# Patient Record
Sex: Male | Born: 1980 | Race: Black or African American | Hispanic: No | Marital: Single | Smoking: Never smoker
Health system: Southern US, Community
[De-identification: ages and names within clinical notes are randomized; demographics above are authoritative.]

## PROBLEM LIST (undated history)

## (undated) DIAGNOSIS — F419 Anxiety disorder, unspecified: Secondary | ICD-10-CM

## (undated) DIAGNOSIS — K219 Gastro-esophageal reflux disease without esophagitis: Secondary | ICD-10-CM

## (undated) DIAGNOSIS — I10 Essential (primary) hypertension: Secondary | ICD-10-CM

## (undated) DIAGNOSIS — Z87442 Personal history of urinary calculi: Secondary | ICD-10-CM

## (undated) DIAGNOSIS — N200 Calculus of kidney: Secondary | ICD-10-CM

## (undated) HISTORY — DX: Essential (primary) hypertension: I10

---

## 2010-07-29 ENCOUNTER — Emergency Department (HOSPITAL_COMMUNITY): Admission: EM | Admit: 2010-07-29 | Discharge: 2010-07-29 | Payer: Self-pay | Admitting: Emergency Medicine

## 2011-02-13 LAB — URINE CULTURE
Colony Count: NO GROWTH
Culture  Setup Time: 201108301536
Culture: NO GROWTH

## 2011-02-13 LAB — URINALYSIS, ROUTINE W REFLEX MICROSCOPIC
Bilirubin Urine: NEGATIVE
Glucose, UA: NEGATIVE mg/dL
Ketones, ur: NEGATIVE mg/dL
Specific Gravity, Urine: 1.02 (ref 1.005–1.030)
Urobilinogen, UA: 1 mg/dL (ref 0.0–1.0)
pH: 7 (ref 5.0–8.0)

## 2011-02-13 LAB — DIFFERENTIAL
Basophils Absolute: 0 10*3/uL (ref 0.0–0.1)
Basophils Relative: 1 % (ref 0–1)
Eosinophils Absolute: 0.2 10*3/uL (ref 0.0–0.7)

## 2011-02-13 LAB — CBC
Hemoglobin: 15.6 g/dL (ref 13.0–17.0)
Platelets: 314 10*3/uL (ref 150–400)
RBC: 5.51 MIL/uL (ref 4.22–5.81)

## 2011-02-13 LAB — BASIC METABOLIC PANEL
Calcium: 9.2 mg/dL (ref 8.4–10.5)
Sodium: 140 mEq/L (ref 135–145)

## 2011-02-13 LAB — GASTRIC OCCULT BLOOD (1-CARD TO LAB)
Occult Blood, Gastric: POSITIVE — AB
pH, Gastric: 5

## 2014-10-09 ENCOUNTER — Ambulatory Visit (INDEPENDENT_AMBULATORY_CARE_PROVIDER_SITE_OTHER): Payer: 59 | Admitting: Family Medicine

## 2014-10-09 ENCOUNTER — Encounter: Payer: Self-pay | Admitting: Family Medicine

## 2014-10-09 ENCOUNTER — Ambulatory Visit (INDEPENDENT_AMBULATORY_CARE_PROVIDER_SITE_OTHER): Payer: 59 | Admitting: *Deleted

## 2014-10-09 VITALS — BP 177/117 | HR 86 | Temp 98.2°F | Ht 69.0 in | Wt 256.1 lb

## 2014-10-09 DIAGNOSIS — Z7189 Other specified counseling: Secondary | ICD-10-CM

## 2014-10-09 DIAGNOSIS — Z23 Encounter for immunization: Secondary | ICD-10-CM

## 2014-10-09 DIAGNOSIS — Z7689 Persons encountering health services in other specified circumstances: Secondary | ICD-10-CM

## 2014-10-09 DIAGNOSIS — N539 Unspecified male sexual dysfunction: Secondary | ICD-10-CM

## 2014-10-09 DIAGNOSIS — I10 Essential (primary) hypertension: Secondary | ICD-10-CM

## 2014-10-09 LAB — CBC WITH DIFFERENTIAL/PLATELET
BASOS ABS: 0.1 10*3/uL (ref 0.0–0.1)
BASOS PCT: 1 % (ref 0–1)
Eosinophils Absolute: 0.1 10*3/uL (ref 0.0–0.7)
Eosinophils Relative: 2 % (ref 0–5)
HCT: 45.7 % (ref 39.0–52.0)
Hemoglobin: 15.7 g/dL (ref 13.0–17.0)
Lymphocytes Relative: 20 % (ref 12–46)
Lymphs Abs: 1.2 10*3/uL (ref 0.7–4.0)
MCH: 26.8 pg (ref 26.0–34.0)
MCHC: 34.4 g/dL (ref 30.0–36.0)
MCV: 78.1 fL (ref 78.0–100.0)
MONO ABS: 0.3 10*3/uL (ref 0.1–1.0)
MONOS PCT: 5 % (ref 3–12)
NEUTROS ABS: 4.4 10*3/uL (ref 1.7–7.7)
Neutrophils Relative %: 72 % (ref 43–77)
Platelets: 327 10*3/uL (ref 150–400)
RBC: 5.85 MIL/uL — ABNORMAL HIGH (ref 4.22–5.81)
RDW: 14 % (ref 11.5–15.5)
WBC: 6.1 10*3/uL (ref 4.0–10.5)

## 2014-10-09 LAB — POCT URINALYSIS DIPSTICK
BILIRUBIN UA: NEGATIVE
Blood, UA: NEGATIVE
GLUCOSE UA: NEGATIVE
Ketones, UA: NEGATIVE
Leukocytes, UA: NEGATIVE
Nitrite, UA: NEGATIVE
PH UA: 7.5
Protein, UA: NEGATIVE
SPEC GRAV UA: 1.02
Urobilinogen, UA: 0.2

## 2014-10-09 LAB — COMPREHENSIVE METABOLIC PANEL
ALK PHOS: 72 U/L (ref 39–117)
ALT: 18 U/L (ref 0–53)
AST: 19 U/L (ref 0–37)
Albumin: 4.5 g/dL (ref 3.5–5.2)
BUN: 12 mg/dL (ref 6–23)
CO2: 25 meq/L (ref 19–32)
Calcium: 9.3 mg/dL (ref 8.4–10.5)
Chloride: 102 mEq/L (ref 96–112)
Creat: 0.97 mg/dL (ref 0.50–1.35)
GLUCOSE: 95 mg/dL (ref 70–99)
POTASSIUM: 4.3 meq/L (ref 3.5–5.3)
SODIUM: 137 meq/L (ref 135–145)
TOTAL PROTEIN: 7.4 g/dL (ref 6.0–8.3)
Total Bilirubin: 0.9 mg/dL (ref 0.2–1.2)

## 2014-10-09 MED ORDER — HYDROCHLOROTHIAZIDE 25 MG PO TABS: 25.0000 mg | ORAL_TABLET | Freq: Every day | ORAL | Status: DC

## 2014-10-09 NOTE — Patient Instructions (Signed)
Thanks for coming in today!   You will be started on a new medication for your blood pressure. It is Hydrochlorothiazide. You will take one pill daily.   As we discussed, it would be a good idea to get a blood pressure cuff that you can use to monitor your blood pressure at home.   I will call you with the results of your blood work whenever it returns.   Make an appointment to be seen back here in the clinic in 2 weeks so that we can check on your blood pressure and get the rest of your blood work. At your follow up appointment, be sure that you come to the clinic without having eaten anything in the past 8 hours.   Thanks for letting us take care of you.   Sincerely,  Devota Pacealeb Marirose Deveney, MD Family Medicine - PGY 1

## 2014-10-17 ENCOUNTER — Encounter: Payer: Self-pay | Admitting: Family Medicine

## 2014-10-17 DIAGNOSIS — N539 Unspecified male sexual dysfunction: Secondary | ICD-10-CM | POA: Insufficient documentation

## 2014-10-17 DIAGNOSIS — I10 Essential (primary) hypertension: Secondary | ICD-10-CM | POA: Insufficient documentation

## 2014-10-17 NOTE — Progress Notes (Signed)
Patient ID: Michael Rich, male   DOB: 1981-07-01, 33 y.o.   MRN: [id number]   [facility] Yolande Jollyaleb G Dawsyn Ramsaran, MD Phone: [phone]  Subjective:   # Pt. Here to establish care with new PCP - He says that up to this point he has not had any health problems.  - He says that he has previously had elevated BP at employment screening physical exams, but has never been diagnosed with high blood pressure.  - He says that he is rarely ill.  - He does not smoke or drink alcohol.  - He does not have any significant family history.  - He has never taken any medications, and he is not allergic to any medications.  - At this time he does not report any vision changes, palpitations, SOB, or fatigue. He has a normal level of activity.   # HTN  - pt. With blood pressure of 177/117 today.  - He denies headache, vision changes, palpitations, or SOB.  - He has no known family history of HTN.   # Sexual Dysfunction  - pt. Reports that he has been having difficulty sustaining an erection after initial erection during sexual activity with his wife.  - He does have night time tumesence, and he does report anxiety prior to sex intermittently.  - He denies painful erection, or numbness / tingling / coldness in the genital / groin area.   All relevant systems were reviewed and were negative unless otherwise noted in the HPI  Past Medical History Reviewed problem list.  Medications- reviewed and updated Current Outpatient Prescriptions  Medication Sig Dispense Refill  . hydrochlorothiazide (HYDRODIURIL) 25 MG tablet Take 1 tablet (25 mg total) by mouth daily. 90 tablet 3   No current facility-administered medications for this visit.   Chief complaint-noted No additions to family history Social history- patient is a non smoker  Objective: BP 177/117 mmHg  Pulse 86  Temp(Src) 98.2 F (36.8 C) (Oral)  Ht 5\' 9"  (1.753 m)  Wt 256 lb 1.6 oz (116.166 kg)  BMI 37.80 kg/m2 Gen:  NAD, alert, cooperative with exam HEENT: NCAT, EOMI, PERRL Neck: FROM, supple, No LAD, No thyromegally CV: RRR, good S1/S2, no murmur, 2+ distal pulses Resp: CTABL, no wheezes, non-labored Abd: SNTND, BS present, no guarding or organomegaly Ext: No edema, warm, normal tone, moves UE/LE spontaneously G/U:  Deferred at this visit Neuro: Alert and oriented, No gross deficits Skin: no rashes no lesions  Assessment/Plan: See problem based a/p

## 2014-10-17 NOTE — Assessment & Plan Note (Signed)
A: pt. With 177/117 BP in clinic today and he reports previous high blood pressure readings in the 160's. No headache, vision changes, palpitations, or SOB at this time.   P:  - Started on HCTZ 12.5mg  qd at this time.  - BMET, CBC, Lipid profile today - F/U HTN in 2 weeks for further change in therapy if current therapy is ineffective.

## 2014-10-17 NOTE — Assessment & Plan Note (Signed)
A: Pt. With complaints of non-sustained erection during attempted sex with his wife. He denies painful erection, and endorses night time erection. He denies dysuria, and denies numbness, tingling, coldness, or other abnormalities. He does endorse anxiety prior to sex.   P:  - Will hold off on evaluating further at this time as he is establishing care at this visit today.  - Will not start erection augmentation therapy as he is starting HCTZ today for BP and would not like to start another agent at the same time.  - Will reevaluate in 2 weeks when he returns for HTN check up.  - Instructed to record events during sex, and if he notices any triggers for nonsustained erection, or if he notices excessive anxiety.

## 2014-10-23 ENCOUNTER — Encounter: Payer: Self-pay | Admitting: Family Medicine

## 2014-10-23 ENCOUNTER — Ambulatory Visit (INDEPENDENT_AMBULATORY_CARE_PROVIDER_SITE_OTHER): Payer: 59 | Admitting: Family Medicine

## 2014-10-23 VITALS — BP 162/128 | HR 84 | Temp 98.2°F | Ht 69.0 in | Wt 256.5 lb

## 2014-10-23 DIAGNOSIS — I10 Essential (primary) hypertension: Secondary | ICD-10-CM

## 2014-10-23 DIAGNOSIS — N539 Unspecified male sexual dysfunction: Secondary | ICD-10-CM

## 2014-10-23 NOTE — Assessment & Plan Note (Signed)
A: Pt. With continued elevated BP 162 / 128 today. Admitted noncompliance today. Will have him return in one week for nurse BP check, and if still elevated will adjust therapy as indicated. No S/S of end organ damage at this time. Last BMET within normal limits.   P:  - Continue HCTZ 25mg  daily at this time.  - Will add ACEI if BP remains elevated in one week despite current therapy.

## 2014-10-23 NOTE — Progress Notes (Signed)
Patient ID: Michael Rich, male   DOB: 1981-06-11, 33 y.o.   MRN: [id number]   [facility] Yolande Jollyaleb G Anira Senegal, MD Phone: [phone]  Subjective:   # Hypertension:  - Pt. With BP 177 / 114 at last office visit. Reports that he has been taking medication daily, but that he did not take his medication this morning. BP today is 162 / 128.  - He denies headache, chest pain , SOB, vision changes.  - He says that he will start taking his blood pressure medicine again upon returning home.   # Sexual Dysfunction   - Pt. With complaints of being unable to sustain an erection longer than 1-2 minutes.  - He reports that he is able to ejaculate despite being unable to sustain an erection.  - He has tumesence at night time upon waking from sleep.  - He reports mild anxiety with sexual activity. He says that he has always had some anxiety with sex.  - He denies anxiety in other situations except for reporting some mild anxiety in large crowds or with meeting new people.  - He denies symptoms of depressed mood, anhedonia, weight gain, difficulty sleeping, or hypersomnia.  - He denies relational dysfunction with his girlfriend. He says that they have a great relationship, but are both dissatisfied with his sexual performance at this time.  - He denies pain with sex.  - He and his partner have tried manual stimulation without the ability to sustain erection.  - He reports excellent desire for sex and for his partner.  - He denies loss of stamina / disinterest in daily activities. - He has no history of drug abuse, ETOH abuse, or smoking.  - He exercises very little at this time.    All relevant systems were reviewed and were negative unless otherwise noted in the HPI  Past Medical History Reviewed problem list.  Medications- reviewed and updated Current Outpatient Prescriptions  Medication Sig Dispense Refill  . hydrochlorothiazide (HYDRODIURIL) 25 MG tablet Take 1 tablet (25  mg total) by mouth daily. 90 tablet 3   No current facility-administered medications for this visit.   Chief complaint-noted No additions to family history Social history- patient is a non smoker  Objective: BP 162/128 mmHg  Pulse 84  Temp(Src) 98.2 F (36.8 C) (Oral)  Ht 5\' 9"  (1.753 m)  Wt 256 lb 8 oz (116.348 kg)  BMI 37.86 kg/m2 Gen: NAD, alert, cooperative with exam HEENT: NCAT, EOMI, PERRL Neck: FROM, supple CV: RRR, good S1/S2, no murmur, PMI appropriate.  Resp: CTABL, no wheezes, non-labored Abd: SNTND, BS present, no guarding or organomegaly GU:  Normal male exam, no deformities, no evidence of peyrone's disease, urethra normal, testicles of normal size without tenderness. Positive cremasteric reflex.  Ext: No edema, warm, normal tone, moves UE/LE spontaneously Neuro: Alert and oriented, No gross deficits Skin: no rashes no lesions  Assessment/Plan: See problem based a/p

## 2014-10-23 NOTE — Patient Instructions (Signed)
Thanks for coming in today!  We will plan to have you come back for a nurse's visit next Monday in order to check your blood pressure. We need to get your blood pressure under control before prescribing other medications at this point.   You may check with your insurance provider to see if you have access to counseling. If so, it would be an excellent place to start.   Exercise is very important for many reasons, but will help with your blood pressure and with reducing anxiety while boosting libido. Exercise by walking or doing light weight. Don't work too hard until we are able to control your blood pressure.   Thanks for coming in today!   Sincerely,  Devota Pacealeb Aubrielle Stroud, MD Family Medicine - PGY 1

## 2014-10-23 NOTE — Assessment & Plan Note (Signed)
A: Pt. With ongoing sexual dysfunction that he says he has had since beginning to have sex at 33 years of age. He reports that he feels that his symptoms are mainly anxiety related. He scored 18/25 on IIEF survey in category A regarding Erectile Function. He does have night time tumesence. At this time he is open to psychosexual therapy as well and feels that that may help. Would hold off on initiating Viagra given ongoing elevated BP and danger of overexertion if BP is uncontrolled.   P:  - Discussed psychosexual therapy.  - Handout given - Will hold off on PDE 5 inhibitor at this time as his BP remains uncontrolled, and IIEF survey is not below threshold of 14 for starting PDE 5 indicating that his symptoms are more likely psychosexual rather than organic.  - Relationship with spouse discussed.

## 2014-10-29 ENCOUNTER — Ambulatory Visit (INDEPENDENT_AMBULATORY_CARE_PROVIDER_SITE_OTHER): Payer: 59 | Admitting: *Deleted

## 2014-10-29 VITALS — BP 170/100 | HR 90

## 2014-10-29 DIAGNOSIS — I10 Essential (primary) hypertension: Secondary | ICD-10-CM

## 2014-10-29 DIAGNOSIS — Z0130 Encounter for examination of blood pressure without abnormal findings: Secondary | ICD-10-CM

## 2014-10-29 DIAGNOSIS — Z136 Encounter for screening for cardiovascular disorders: Secondary | ICD-10-CM

## 2014-10-29 NOTE — Progress Notes (Signed)
   Pt in nurse clinic for blood pressure check.  BP 170/100 manually, heart rate 90.  Pt denies any chest pain, dizziness, SOB, numbness/tingling of extremities.  Pt stated he is having headaches; sometimes relieved with OTC medication.  Pt advised to call clinic or go to ER with complaints of any of the symptoms listed.  Pt stated understanding.  Pt did take BP medication today. Will forward to PCP.  Clovis PuMartin, Marcella Charlson L, RN

## 2015-10-03 ENCOUNTER — Ambulatory Visit (INDEPENDENT_AMBULATORY_CARE_PROVIDER_SITE_OTHER): Payer: Self-pay | Admitting: Family Medicine

## 2015-10-03 ENCOUNTER — Encounter: Payer: Self-pay | Admitting: Family Medicine

## 2015-10-03 VITALS — BP 160/98 | HR 82 | Temp 98.1°F | Ht 69.0 in | Wt 249.0 lb

## 2015-10-03 DIAGNOSIS — N539 Unspecified male sexual dysfunction: Secondary | ICD-10-CM

## 2015-10-03 DIAGNOSIS — K219 Gastro-esophageal reflux disease without esophagitis: Secondary | ICD-10-CM | POA: Insufficient documentation

## 2015-10-03 DIAGNOSIS — I10 Essential (primary) hypertension: Secondary | ICD-10-CM

## 2015-10-03 DIAGNOSIS — Z83438 Family history of other disorder of lipoprotein metabolism and other lipidemia: Secondary | ICD-10-CM

## 2015-10-03 DIAGNOSIS — Z8349 Family history of other endocrine, nutritional and metabolic diseases: Secondary | ICD-10-CM

## 2015-10-03 DIAGNOSIS — Z23 Encounter for immunization: Secondary | ICD-10-CM

## 2015-10-03 MED ORDER — AMLODIPINE BESYLATE 5 MG PO TABS: 10.0000 mg | ORAL_TABLET | Freq: Every day | ORAL | Status: AC

## 2015-10-03 MED ORDER — AMLODIPINE BESYLATE 10 MG PO TABS: 10.0000 mg | ORAL_TABLET | Freq: Every day | ORAL | Status: DC

## 2015-10-03 NOTE — Assessment & Plan Note (Signed)
Pt. To try to visit therapist again with his GF. He understands that we cannot use PDE5 inhibitor until we get his BP under control.  - F/U as needed.  - Predominantly an anxiety component rather than organic disorder.

## 2015-10-03 NOTE — Progress Notes (Signed)
Patient ID: Michael Rich, male   DOB: 05/14/1981, 34 y.o.   MRN: [id number]   [facility] Yolande Jollyaleb G Skye Rodarte, MD Phone: [phone]  Subjective:   # HTN - Pt. Presents today for folloow up of HTN - His initial BP today is 168 / 93 with subsequent 144/78.  - he says that he is mostly compliant with his HCTZ, but does intermittently miss doses.  - He has not had any CP, SOB, LE swelling, or vision changes.  - He says that he does not get headaches when his bp is high. - He does not check his bp at home.  - Has not been able to be compliant with low sodium diet.   # Acid Reflux   - Complains of some discomfort after eating large meals.  - Is not daily, does not occur at night, but he says that it happens every now and then.  - Has not tried anything for this.  - He does not have dysphagia, or odynophagia, or weight loss.  - no dark, or bloody stools.   # Sexual dysfunction  - Never followed up with the therapist after our last discussion.  - He says that he is still struggling with this with his girlfriend.  - He says he will try to see the therapist.    All relevant systems were reviewed and were negative unless otherwise noted in the HPI  Past Medical History Reviewed problem list.  Medications- reviewed and updated Current Outpatient Prescriptions  Medication Sig Dispense Refill  . amLODipine (NORVASC) 5 MG tablet Take 2 tablets (10 mg total) by mouth daily. Take 0.5 tablet per day for the first week. 90 tablet 5  . hydrochlorothiazide (HYDRODIURIL) 25 MG tablet Take 1 tablet (25 mg total) by mouth daily. 90 tablet 3   No current facility-administered medications for this visit.   Chief complaint-noted No additions to family history Social history- patient is a non smoker, does not drink, or use other illegal substances.   Objective: BP 160/98 mmHg  Pulse 82  Temp(Src) 98.1 F (36.7 C) (Oral)  Ht 5\' 9"  (1.753 m)  Wt 249 lb (112.946 kg)  BMI  36.75 kg/m2 Gen: NAD, alert, cooperative with exam HEENT: NCAT, EOMI, PERRL Neck: FROM, supple CV: RRR, good S1/S2, no murmur Resp: CTABL, no wheezes, non-labored Abd: SNTND, BS present, no guarding or organomegaly Ext: No edema, warm, normal tone, moves UE/LE spontaneously Neuro: Alert and oriented, No gross deficits Skin: no rashes no lesions  Assessment/Plan: See problem based a/p  Essential hypertension Pt. With continued elevated BP despite monotherapy with HCTZ 25mg . He needs much better control given his age for long term risk reduction.   - Started on Amlodipine 5mg  daily.  - Goal < 140/90 - Will check Lipids for screening as well.  - F/U in 2 weeks for BP check.   Male sexual dysfunction Pt. To try to visit therapist again with his GF. He understands that we cannot use PDE5 inhibitor until we get his BP under control.  - F/U as needed.  - Predominantly an anxiety component rather than organic disorder.   GERD (gastroesophageal reflux disease) Reflux by history. Will use tum's for now. PPI in the future if uncontrolled by TUMs. No red flag symptoms.

## 2015-10-03 NOTE — Assessment & Plan Note (Signed)
Pt. With continued elevated BP despite monotherapy with HCTZ 25mg . He needs much better control given his age for long term risk reduction.   - Started on Amlodipine 5mg  daily.  - Goal < 140/90 - Will check Lipids for screening as well.  - F/U in 2 weeks for BP check.

## 2015-10-03 NOTE — Assessment & Plan Note (Addendum)
Reflux by history. Will use tum's for now. PPI in the future if uncontrolled by TUMs. No red flag symptoms.

## 2015-10-03 NOTE — Patient Instructions (Addendum)
Thanks for coming in today.   Your blood pressure is still very high. We need to get it down to 140 / 90. You were 160/98 today.   Continue to take the Hydrochlorothiazide, and you will also start the Norvasc 5mg  pill. Take this daily.   Schedule an appointment to get your blood pressure checked in 2 weeks at a Nurse's visit.   Schedule a lab appointment for any day in the next two weeks that is convenient for you. This should be in the morning before you have eaten anything.   Take Tum's for acid reflux as needed. If you remain symptomatic, call back and we can send in a different medication.   Go to the therapist with your girlfriend. This will be the most beneficial first step.   Thanks for letting us take care of you.   Sincerely,  Devota Pacealeb Ranisha Allaire, MD Family Medicine - PGY 2

## 2015-10-07 ENCOUNTER — Telehealth: Payer: Self-pay | Admitting: Family Medicine

## 2015-10-07 DIAGNOSIS — I10 Essential (primary) hypertension: Secondary | ICD-10-CM

## 2015-10-07 MED ORDER — HYDROCHLOROTHIAZIDE 25 MG PO TABS: 25.0000 mg | ORAL_TABLET | Freq: Every day | ORAL | Status: DC

## 2015-10-07 NOTE — Telephone Encounter (Signed)
Pt called because he was seen last and the doctor was going to call in a new medication for him. He doesn't remember the name. Please send this to his new pharmacy [facility] [address] jw

## 2015-10-07 NOTE — Telephone Encounter (Signed)
Medication resent to new pharmacy.  Laurey Salser,CMA ° °

## 2015-10-14 ENCOUNTER — Other Ambulatory Visit: Payer: Self-pay | Admitting: *Deleted

## 2015-10-14 ENCOUNTER — Other Ambulatory Visit: Payer: 59

## 2015-10-14 DIAGNOSIS — I10 Essential (primary) hypertension: Secondary | ICD-10-CM

## 2015-10-14 LAB — LIPID PANEL
Cholesterol: 195 mg/dL (ref 125–200)
HDL: 27 mg/dL — ABNORMAL LOW (ref >=40)
LDL CALC: 133 mg/dL — AB (ref <130)
Total CHOL/HDL Ratio: 7.2 Ratio — ABNORMAL HIGH (ref <=5.0)
Triglycerides: 174 mg/dL — ABNORMAL HIGH (ref <150)
VLDL: 35 mg/dL — AB (ref <30)

## 2015-10-14 NOTE — Progress Notes (Signed)
flp done today Michael Rich 

## 2015-10-31 ENCOUNTER — Telehealth: Payer: Self-pay | Admitting: Family Medicine

## 2015-10-31 NOTE — Telephone Encounter (Signed)
Called to discuss recent lipid panel results. Slightly elevated. He needs lifestyle modification at this point. We will recheck in 6 months, and initiate treatment in the future as indicated. Pt. Acknowledged this result, and will schedule appt. In 6 months.   Devota Pacealeb Wisdom Seybold, MD Family Medicine - PGY 2

## 2016-03-08 ENCOUNTER — Encounter (HOSPITAL_COMMUNITY): Payer: Self-pay | Admitting: *Deleted

## 2016-03-08 ENCOUNTER — Emergency Department (HOSPITAL_COMMUNITY): Payer: Self-pay

## 2016-03-08 ENCOUNTER — Emergency Department (HOSPITAL_COMMUNITY)
Admission: EM | Admit: 2016-03-08 | Discharge: 2016-03-08 | Disposition: A | Discharge status: Home / Self Care | Payer: Self-pay | Attending: Emergency Medicine | Admitting: Emergency Medicine

## 2016-03-08 DIAGNOSIS — I10 Essential (primary) hypertension: Secondary | ICD-10-CM | POA: Insufficient documentation

## 2016-03-08 DIAGNOSIS — N23 Unspecified renal colic: Secondary | ICD-10-CM | POA: Insufficient documentation

## 2016-03-08 DIAGNOSIS — Z87442 Personal history of urinary calculi: Secondary | ICD-10-CM | POA: Insufficient documentation

## 2016-03-08 DIAGNOSIS — Z79899 Other long term (current) drug therapy: Secondary | ICD-10-CM | POA: Insufficient documentation

## 2016-03-08 LAB — URINALYSIS, ROUTINE W REFLEX MICROSCOPIC
Bilirubin Urine: NEGATIVE
Glucose, UA: NEGATIVE mg/dL
Ketones, ur: NEGATIVE mg/dL
Leukocytes, UA: NEGATIVE
Nitrite: NEGATIVE
Protein, ur: NEGATIVE mg/dL
Specific Gravity, Urine: 1.02 (ref 1.005–1.030)
pH: 6.5 (ref 5.0–8.0)

## 2016-03-08 LAB — CBC
HEMATOCRIT: 45.4 % (ref 39.0–52.0)
HEMOGLOBIN: 15.3 g/dL (ref 13.0–17.0)
MCH: 26.7 pg (ref 26.0–34.0)
MCHC: 33.7 g/dL (ref 30.0–36.0)
MCV: 79.2 fL (ref 78.0–100.0)
Platelets: 388 10*3/uL (ref 150–400)
RBC: 5.73 MIL/uL (ref 4.22–5.81)
RDW: 13.8 % (ref 11.5–15.5)
WBC: 9.5 10*3/uL (ref 4.0–10.5)

## 2016-03-08 LAB — COMPREHENSIVE METABOLIC PANEL WITH GFR
ALT: 24 U/L (ref 17–63)
AST: 27 U/L (ref 15–41)
Albumin: 3.8 g/dL (ref 3.5–5.0)
Alkaline Phosphatase: 64 U/L (ref 38–126)
Anion gap: 13 (ref 5–15)
BUN: 12 mg/dL (ref 6–20)
CO2: 27 mmol/L (ref 22–32)
Calcium: 9.2 mg/dL (ref 8.9–10.3)
Chloride: 100 mmol/L — ABNORMAL LOW (ref 101–111)
Creatinine, Ser: 1.32 mg/dL — ABNORMAL HIGH (ref 0.61–1.24)
GFR calc Af Amer: >60 mL/min
GFR calc non Af Amer: >60 mL/min
Glucose, Bld: 124 mg/dL — ABNORMAL HIGH (ref 65–99)
Potassium: 3.4 mmol/L — ABNORMAL LOW (ref 3.5–5.1)
Sodium: 140 mmol/L (ref 135–145)
Total Bilirubin: 0.9 mg/dL (ref 0.3–1.2)
Total Protein: 7.6 g/dL (ref 6.5–8.1)

## 2016-03-08 LAB — URINE MICROSCOPIC-ADD ON: Squamous Epithelial / LPF: NONE SEEN

## 2016-03-08 LAB — LIPASE, BLOOD: LIPASE: 19 U/L (ref 11–51)

## 2016-03-08 MED ORDER — HYDROMORPHONE HCL 1 MG/ML IJ SOLN
1.0000 mg | Freq: Once | INTRAMUSCULAR | Status: AC
  Administered 2016-03-08: 1 mg via INTRAVENOUS
  Filled 2016-03-08: qty 1

## 2016-03-08 MED ORDER — KETOROLAC TROMETHAMINE 30 MG/ML IJ SOLN
30.0000 mg | Freq: Once | INTRAMUSCULAR | Status: AC
  Administered 2016-03-08: 30 mg via INTRAVENOUS
  Filled 2016-03-08: qty 1

## 2016-03-08 MED ORDER — FENTANYL CITRATE (PF) 100 MCG/2ML IJ SOLN
50.0000 ug | INTRAMUSCULAR | Status: DC | PRN
  Administered 2016-03-08: 50 ug via INTRAVENOUS
  Filled 2016-03-08: qty 2

## 2016-03-08 MED ORDER — OXYCODONE-ACETAMINOPHEN 5-325 MG PO TABS: 1.0000 | ORAL_TABLET | ORAL | Status: DC | PRN

## 2016-03-08 MED ORDER — TAMSULOSIN HCL 0.4 MG PO CAPS: 0.4000 mg | ORAL_CAPSULE | Freq: Every day | ORAL | Status: DC

## 2016-03-08 MED ORDER — PROMETHAZINE HCL 25 MG PO TABS
25.0000 mg | ORAL_TABLET | Freq: Four times a day (QID) | ORAL | Status: DC | PRN
Start: 2016-03-08 — End: 2021-02-18

## 2016-03-08 MED ORDER — ONDANSETRON 4 MG PO TBDP
8.0000 mg | ORAL_TABLET | Freq: Once | ORAL | Status: AC
  Administered 2016-03-08: 8 mg via ORAL
  Filled 2016-03-08: qty 2

## 2016-03-08 NOTE — ED Provider Notes (Signed)
CSN: [id number]     Arrival date & time 03/08/16  0035 History  By signing my name below, I, Phillis Haggis, attest that this documentation has been prepared under the direction and in the presence of Gilda Crease, MD. Electronically Signed: Phillis Haggis, ED Scribe. 03/08/2016. 2:36 AM.  Chief Complaint  Patient presents with  . Abdominal Pain   The history is provided by the patient. No language interpreter was used.  HPI Comments: Michael Rich is a 35 y.o. male with a hx of kidney stones who presents to the Emergency Department complaining of left flank pain onset 4 hours ago. Pt reports associated nausea and vomiting. Pt reports that these symptoms feel like his past kidney stones. He denies abdominal pain, diarrhea, hematuria, dysuria, or frequency.   Past Medical History  Diagnosis Date  . Essential hypertension    History reviewed. No pertinent past surgical history. Family History  Problem Relation Age of Onset  . Hypertension Father    Social History  Substance Use Topics  . Smoking status: Never Smoker   . Smokeless tobacco: None  . Alcohol Use: None    Review of Systems  Gastrointestinal: Positive for nausea and vomiting. Negative for abdominal pain and diarrhea.  Genitourinary: Positive for flank pain. Negative for dysuria, frequency and hematuria.  All other systems reviewed and are negative.   Allergies  Review of patient's allergies indicates no known allergies.  Home Medications   Prior to Admission medications   Medication Sig Start Date End Date Taking? Authorizing Provider  hydrochlorothiazide (HYDRODIURIL) 25 MG tablet Take 1 tablet (25 mg total) by mouth daily. 10/07/15  Yes Yolande Jolly, MD  amLODipine (NORVASC) 5 MG tablet Take 2 tablets (10 mg total) by mouth daily. Take 0.5 tablet per day for the first week. Patient not taking: Reported on 03/08/2016 10/03/15   Yolande Jolly, MD  oxyCODONE-acetaminophen (PERCOCET) 5-325 MG tablet Take  1-2 tablets by mouth every 4 (four) hours as needed. 03/08/16   Gilda Crease, MD  promethazine (PHENERGAN) 25 MG tablet Take 1 tablet (25 mg total) by mouth every 6 (six) hours as needed for nausea or vomiting. 03/08/16   Gilda Crease, MD  tamsulosin (FLOMAX) 0.4 MG CAPS capsule Take 1 capsule (0.4 mg total) by mouth daily. 03/08/16   Gilda Crease, MD   BP 168/94 mmHg  Pulse 95  Temp(Src) 98.2 F (36.8 C) (Oral)  Resp 20  Ht  (1.753 m)  Wt 264 lb 6 oz (119.92 kg)  BMI 39.02 kg/m2  SpO2 92% Physical Exam  Constitutional: He is oriented to person, place, and time. He appears well-developed and well-nourished.  Uncomfortable  HENT:  Head: Normocephalic and atraumatic.  Right Ear: Hearing normal.  Left Ear: Hearing normal.  Nose: Nose normal.  Mouth/Throat: Oropharynx is clear and moist and mucous membranes are normal.  Eyes: Conjunctivae and EOM are normal. Pupils are equal, round, and reactive to light.  Neck: Normal range of motion. Neck supple.  Cardiovascular: Regular rhythm, S1 normal and S2 normal.  Exam reveals no gallop and no friction rub.   No murmur heard. Pulmonary/Chest: Effort normal and breath sounds normal. No respiratory distress. He exhibits no tenderness.  Abdominal: Soft. Normal appearance and bowel sounds are normal. There is no hepatosplenomegaly. There is no tenderness. There is CVA tenderness. There is no rebound, no guarding, no tenderness at McBurney's point and negative Murphy's sign. No hernia.  Left CVA tenderness  Musculoskeletal: Normal  range of motion.  Neurological: He is alert and oriented to person, place, and time. He has normal strength. No cranial nerve deficit or sensory deficit. Coordination normal. GCS eye subscore is 4. GCS verbal subscore is 5. GCS motor subscore is 6.  Skin: Skin is warm, dry and intact. No rash noted. No cyanosis.  Psychiatric: He has a normal mood and affect. His speech is normal and behavior is  normal. Thought content normal.  Nursing note and vitals reviewed.   ED Course  Procedures (including critical care time) DIAGNOSTIC STUDIES: Oxygen Saturation is 97% on RA, normal by my interpretation.    COORDINATION OF CARE: 2:29 AM-Discussed treatment plan which includes labs and pain medication with pt at bedside and pt agreed to plan.    Labs Review Labs Reviewed  COMPREHENSIVE METABOLIC PANEL - Abnormal; Notable for the following:    Potassium 3.4 (*)    Chloride 100 (*)    Glucose, Bld 124 (*)    Creatinine, Ser 1.32 (*)    All other components within normal limits  URINALYSIS, ROUTINE W REFLEX MICROSCOPIC (NOT AT [facility]) - Abnormal; Notable for the following:    Hgb urine dipstick LARGE (*)    All other components within normal limits  URINE MICROSCOPIC-ADD ON - Abnormal; Notable for the following:    Bacteria, UA RARE (*)    All other components within normal limits  LIPASE, BLOOD  CBC    Imaging Review Ct Renal Stone Study  03/08/2016  CLINICAL DATA:  Left flank pain, nausea and vomiting. Onset last night. EXAM: CT ABDOMEN AND PELVIS WITHOUT CONTRAST TECHNIQUE: Multidetector CT imaging of the abdomen and pelvis was performed following the standard protocol without IV contrast. COMPARISON:  07/29/2010 FINDINGS: There is a 3 x 4 mm distal left ureteral calculus, within 2 cm of the ureterovesical junction. This obstructs the left ureter, with marked hydroureter and hydronephrosis. There is an additional 4 x 5 mm calculus and a lower pole left collecting system calyx. There are punctate right collecting system calculi measuring up to 2 mm. No other acute findings are evident in the abdomen or pelvis. There are unremarkable unenhanced appearances of the liver, gallbladder, bile ducts, pancreas, spleen and adrenals. The abdominal aorta is normal in caliber. There is no atherosclerotic calcification. There is no adenopathy in the abdomen or pelvis. There is a moderate hiatal hernia.  The stomach and small bowel are otherwise normal. The appendix is normal. The colon is unremarkable. There is no ascites. There is no extraluminal air. There is no abnormality in the lung bases. There is no significant musculoskeletal lesion. IMPRESSION: 1. Obstructing 3 x 4 mm distal left ureteral calculus with marked hydronephrosis. The calculus is less than 2 cm from the UVJ. 2. Bilateral nephrolithiasis 3. Hiatal hernia. Electronically Signed   By: Ellery Plunkaniel R Mitchell M.D.   On: 03/08/2016 05:14   I have personally reviewed and evaluated these images and lab results as part of my medical decision-making.   EKG Interpretation None      MDM   Final diagnoses:  Renal colic on left side   Presents with nausea, vomiting, flank pain. Patient has a previous history of kidney stones. Urinalysis showed large blood, symptoms consistent with renal colic. Patient given 2 doses of analgesia, however, and still had significant pain. CT scan was therefore performed to confirm diagnosis and identify size and placement of stone. Patient has a distal stone that appears to be possible in size. He was administered Toradol,  additional Dilaudid and will be discharged with Flomax, Percocet and follow-up with urology.  I personally performed the services described in this documentation, which was scribed in my presence. The recorded information has been reviewed and is accurate.    Gilda Crease, MD 03/08/16 [phone]

## 2016-03-08 NOTE — ED Notes (Signed)
The pt is c/o abd with  nv some diarrhea fort ONE  hour

## 2016-03-08 NOTE — Discharge Instructions (Signed)
Kidney Stones °Kidney stones (urolithiasis) are deposits that form inside your kidneys. The intense pain is caused by the stone moving through the urinary tract. When the stone moves, the ureter goes into spasm around the stone. The stone is usually passed in the urine.  °CAUSES  °· A disorder that makes certain neck glands produce too much parathyroid hormone (primary hyperparathyroidism). °· A buildup of uric acid crystals, similar to gout in your joints. °· Narrowing (stricture) of the ureter. °· A kidney obstruction present at birth (congenital obstruction). °· Previous surgery on the kidney or ureters. °· Numerous kidney infections. °SYMPTOMS  °· Feeling sick to your stomach (nauseous). °· Throwing up (vomiting). °· Blood in the urine (hematuria). °· Pain that usually spreads (radiates) to the groin. °· Frequency or urgency of urination. °DIAGNOSIS  °· Taking a history and physical exam. °· Blood or urine tests. °· CT scan. °· Occasionally, an examination of the inside of the urinary bladder (cystoscopy) is performed. °TREATMENT  °· Observation. °· Increasing your fluid intake. °· Extracorporeal shock wave lithotripsy--This is a noninvasive procedure that uses shock waves to break up kidney stones. °· Surgery may be needed if you have severe pain or persistent obstruction. There are various surgical procedures. Most of the procedures are performed with the use of small instruments. Only small incisions are needed to accommodate these instruments, so recovery time is minimized. °The size, location, and chemical composition are all important variables that will determine the proper choice of action for you. Talk to your health care provider to better understand your situation so that you will minimize the risk of injury to yourself and your kidney.  °HOME CARE INSTRUCTIONS  °· Drink enough water and fluids to keep your urine clear or pale yellow. This will help you to pass the stone or stone fragments. °· Strain  all urine through the provided strainer. Keep all particulate matter and stones for your health care provider to see. The stone causing the pain may be as small as a grain of salt. It is very important to use the strainer each and every time you pass your urine. The collection of your stone will allow your health care provider to analyze it and verify that a stone has actually passed. The stone analysis will often identify what you can do to reduce the incidence of recurrences. °· Only take over-the-counter or prescription medicines for pain, discomfort, or fever as directed by your health care provider. °· Keep all follow-up visits as told by your health care provider. This is important. °· Get follow-up X-rays if required. The absence of pain does not always mean that the stone has passed. It may have only stopped moving. If the urine remains completely obstructed, it can cause loss of kidney function or even complete destruction of the kidney. It is your responsibility to make sure X-rays and follow-ups are completed. Ultrasounds of the kidney can show blockages and the status of the kidney. Ultrasounds are not associated with any radiation and can be performed easily in a matter of minutes. °· Make changes to your daily diet as told by your health care provider. You may be told to: °¨ Limit the amount of salt that you eat. °¨ Eat 5 or more servings of fruits and vegetables each day. °¨ Limit the amount of meat, poultry, fish, and eggs that you eat. °· Collect a 24-hour urine sample as told by your health care provider. You may need to collect another urine sample every 6-12   months. °SEEK MEDICAL CARE IF: °· You experience pain that is progressive and unresponsive to any pain medicine you have been prescribed. °SEEK IMMEDIATE MEDICAL CARE IF:  °· Pain cannot be controlled with the prescribed medicine. °· You have a fever or shaking chills. °· The severity or intensity of pain increases over 18 hours and is not  relieved by pain medicine. °· You develop a new onset of abdominal pain. °· You feel faint or pass out. °· You are unable to urinate. °  °This information is not intended to replace advice given to you by your health care provider. Make sure you discuss any questions you have with your health care provider. °  °Document Released: 11/16/2005 Document Revised: 08/07/2015 Document Reviewed: 04/19/2013 °Elsevier Interactive Patient Education ©2016 [employer] ° °

## 2016-03-08 NOTE — ED Notes (Signed)
Pt states that his pain is still 10/10 after fentanyl admin, Dr Blinda LeatherwoodPollina notified and to order dilaudid.

## 2016-03-08 NOTE — ED Notes (Signed)
The first respirations vital is incorrect, that was the heart rate.

## 2016-08-21 ENCOUNTER — Emergency Department (HOSPITAL_COMMUNITY): Payer: Self-pay

## 2016-08-21 ENCOUNTER — Emergency Department (HOSPITAL_COMMUNITY)
Admission: EM | Admit: 2016-08-21 | Discharge: 2016-08-21 | Disposition: A | Discharge status: Home / Self Care | Payer: Self-pay | Attending: Emergency Medicine | Admitting: Emergency Medicine

## 2016-08-21 ENCOUNTER — Encounter (HOSPITAL_COMMUNITY): Payer: Self-pay | Admitting: Emergency Medicine

## 2016-08-21 DIAGNOSIS — N202 Calculus of kidney with calculus of ureter: Secondary | ICD-10-CM | POA: Insufficient documentation

## 2016-08-21 DIAGNOSIS — I10 Essential (primary) hypertension: Secondary | ICD-10-CM | POA: Insufficient documentation

## 2016-08-21 DIAGNOSIS — N201 Calculus of ureter: Secondary | ICD-10-CM

## 2016-08-21 HISTORY — DX: Calculus of kidney: N20.0

## 2016-08-21 LAB — URINALYSIS, ROUTINE W REFLEX MICROSCOPIC
Bilirubin Urine: NEGATIVE
GLUCOSE, UA: NEGATIVE mg/dL
Ketones, ur: NEGATIVE mg/dL
LEUKOCYTES UA: NEGATIVE
Nitrite: NEGATIVE
PH: 6 (ref 5.0–8.0)
Protein, ur: 30 mg/dL — AB
Specific Gravity, Urine: 1.024 (ref 1.005–1.030)

## 2016-08-21 LAB — COMPREHENSIVE METABOLIC PANEL
ALBUMIN: 4 g/dL (ref 3.5–5.0)
ALT: 24 U/L (ref 17–63)
AST: 30 U/L (ref 15–41)
Alkaline Phosphatase: 62 U/L (ref 38–126)
Anion gap: 9 (ref 5–15)
BUN: 13 mg/dL (ref 6–20)
CHLORIDE: 105 mmol/L (ref 101–111)
CO2: 26 mmol/L (ref 22–32)
Calcium: 9.4 mg/dL (ref 8.9–10.3)
Creatinine, Ser: 1.38 mg/dL — ABNORMAL HIGH (ref 0.61–1.24)
GFR calc Af Amer: >60 mL/min (ref >60)
GFR calc non Af Amer: >60 mL/min (ref >60)
GLUCOSE: 135 mg/dL — AB (ref 65–99)
POTASSIUM: 3.6 mmol/L (ref 3.5–5.1)
SODIUM: 140 mmol/L (ref 135–145)
Total Bilirubin: 0.8 mg/dL (ref 0.3–1.2)
Total Protein: 7.5 g/dL (ref 6.5–8.1)

## 2016-08-21 LAB — CBC WITH DIFFERENTIAL/PLATELET
BASOS ABS: 0.1 10*3/uL (ref 0.0–0.1)
BASOS PCT: 1 %
EOS PCT: 3 %
Eosinophils Absolute: 0.2 10*3/uL (ref 0.0–0.7)
HEMATOCRIT: 47.3 % (ref 39.0–52.0)
Hemoglobin: 15.5 g/dL (ref 13.0–17.0)
Lymphocytes Relative: 21 %
Lymphs Abs: 1.8 10*3/uL (ref 0.7–4.0)
MCH: 26.2 pg (ref 26.0–34.0)
MCHC: 32.8 g/dL (ref 30.0–36.0)
MCV: 79.9 fL (ref 78.0–100.0)
MONO ABS: 0.6 10*3/uL (ref 0.1–1.0)
Monocytes Relative: 7 %
NEUTROS ABS: 6 10*3/uL (ref 1.7–7.7)
Neutrophils Relative %: 68 %
PLATELETS: 340 10*3/uL (ref 150–400)
RBC: 5.92 MIL/uL — ABNORMAL HIGH (ref 4.22–5.81)
RDW: 13.8 % (ref 11.5–15.5)
WBC: 8.7 10*3/uL (ref 4.0–10.5)

## 2016-08-21 LAB — URINE MICROSCOPIC-ADD ON

## 2016-08-21 LAB — LIPASE, BLOOD: Lipase: 18 U/L (ref 11–51)

## 2016-08-21 MED ORDER — OXYCODONE-ACETAMINOPHEN 5-325 MG PO TABS: 1.0000 | ORAL_TABLET | Freq: Four times a day (QID) | ORAL | 0 refills | Status: DC | PRN

## 2016-08-21 MED ORDER — SODIUM CHLORIDE 0.9 % IV BOLUS (SEPSIS)
1000.0000 mL | Freq: Once | INTRAVENOUS | Status: AC
  Administered 2016-08-21: 1000 mL via INTRAVENOUS

## 2016-08-21 MED ORDER — ONDANSETRON HCL 4 MG PO TABS: 4.0000 mg | ORAL_TABLET | Freq: Four times a day (QID) | ORAL | 0 refills | Status: DC | PRN

## 2016-08-21 MED ORDER — ONDANSETRON HCL 4 MG/2ML IJ SOLN
4.0000 mg | Freq: Once | INTRAMUSCULAR | Status: AC
  Administered 2016-08-21: 4 mg via INTRAVENOUS
  Filled 2016-08-21: qty 2

## 2016-08-21 MED ORDER — HYDROMORPHONE HCL 1 MG/ML IJ SOLN
1.0000 mg | Freq: Once | INTRAMUSCULAR | Status: AC
  Administered 2016-08-21: 1 mg via INTRAVENOUS
  Filled 2016-08-21: qty 1

## 2016-08-21 MED ORDER — TAMSULOSIN HCL 0.4 MG PO CAPS: 0.4000 mg | ORAL_CAPSULE | Freq: Every day | ORAL | 0 refills | Status: DC

## 2016-08-21 MED ORDER — KETOROLAC TROMETHAMINE 30 MG/ML IJ SOLN
30.0000 mg | Freq: Once | INTRAMUSCULAR | Status: AC
  Administered 2016-08-21: 30 mg via INTRAVENOUS
  Filled 2016-08-21: qty 1

## 2016-08-21 NOTE — ED Triage Notes (Signed)
Pt c/o left flank pain with hx of kidney stones-- pt is diaphoretic , vomiting, and writhing in pain.

## 2016-08-21 NOTE — ED Notes (Signed)
Pt states he is unable to provide urine sample at this time

## 2016-08-21 NOTE — Discharge Instructions (Signed)
You have dropped a kidney stone on the left side. Please take medications as prescribed. You are given urology follow-up as needed, listed above. Please return for worsening symptoms, including fevers, intractable vomiting, escalating pain despite medications, or any other symptoms concerning to you.

## 2016-08-21 NOTE — ED Notes (Signed)
Patient unable to urinate at this time. 

## 2016-08-21 NOTE — ED Provider Notes (Signed)
[facility] DEPT Provider Note   CSN: [id number] Arrival date & time: 08/21/16  0907     History   Chief Complaint Chief Complaint  Patient presents with  . Nephrolithiasis    HPI Michael Rich is a 35 y.o. male.  The history is provided by the patient.  Flank Pain  This is a new problem. The current episode started 3 to 5 hours ago. The problem occurs constantly. The problem has been rapidly worsening. Pertinent negatives include no chest pain, no abdominal pain and no shortness of breath. Nothing aggravates the symptoms. Nothing relieves the symptoms. He has tried nothing for the symptoms.   35 year old who presents with left flank pain. History of nephrolithiasis. No prior abdominal surgeries. He had sudden onset of sharp left flank pain this morning at 7 AM. Symptoms are the same as prior history of urolithiasis. Associated with nausea and vomiting. No fevers, abdominal pain, dysuria, urinary frequency, hematuria, chest pain or difficulty breathing. No aggravating or alleviating factors.   Past Medical History:  Diagnosis Date  . Essential hypertension   . Kidney stone     Patient Active Problem List   Diagnosis Date Noted  . GERD (gastroesophageal reflux disease) 10/03/2015  . Essential hypertension 10/17/2014  . 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     Home Medications    Prior to Admission medications   Medication Sig Start Date End Date Taking? Authorizing Provider  hydrochlorothiazide (HYDRODIURIL) 25 MG tablet Take 1 tablet (25 mg total) by mouth daily. 10/07/15  Yes Yolande Jolly, MD  ibuprofen (ADVIL,MOTRIN) 200 MG tablet Take 400 mg by mouth every 6 (six) hours as needed (Pain).   Yes Historical Provider, MD  amLODipine (NORVASC) 5 MG tablet Take 2 tablets (10 mg total) by mouth daily. Take 0.5 tablet per day for the first week. Patient not taking: Reported on 08/21/2016 10/03/15   Yolande Jolly, MD  ondansetron  (ZOFRAN) 4 MG tablet Take 1 tablet (4 mg total) by mouth every 6 (six) hours as needed for nausea or vomiting. 08/21/16   Lavera Guise, MD  oxyCODONE-acetaminophen (PERCOCET) 5-325 MG tablet Take 1-2 tablets by mouth every 4 (four) hours as needed. Patient not taking: Reported on 08/21/2016 03/08/16   Gilda Crease, MD  oxyCODONE-acetaminophen (PERCOCET/ROXICET) 5-325 MG tablet Take 1-2 tablets by mouth every 6 (six) hours as needed for moderate pain or severe pain. 08/21/16   Lavera Guise, MD  promethazine (PHENERGAN) 25 MG tablet Take 1 tablet (25 mg total) by mouth every 6 (six) hours as needed for nausea or vomiting. Patient not taking: Reported on 08/21/2016 03/08/16   Gilda Crease, MD  tamsulosin (FLOMAX) 0.4 MG CAPS capsule Take 1 capsule (0.4 mg total) by mouth daily. Patient not taking: Reported on 08/21/2016 03/08/16   Gilda Crease, MD  tamsulosin (FLOMAX) 0.4 MG CAPS capsule Take 1 capsule (0.4 mg total) by mouth daily. 08/21/16   Lavera Guise, MD    Family History Family History  Problem Relation Age of Onset  . Hypertension Father     Social History Social History  Substance Use Topics  . Smoking status: Never Smoker  . Smokeless tobacco: Not on file  . Alcohol use Not on file     Allergies   Review of patient's allergies indicates no known allergies.   Review of Systems Review of Systems  Respiratory: Negative for shortness of breath.   Cardiovascular: Negative  for chest pain.  Gastrointestinal: Negative for abdominal pain.  Genitourinary: Positive for flank pain.  All other systems reviewed and are negative.    Physical Exam Updated Vital Signs BP 137/72   Pulse 83   Temp 97.7 F (36.5 C) (Oral)   Resp 16   Ht 5\' 9"  (1.753 m)   Wt 262 lb (118.8 kg)   SpO2 96%   BMI 38.69 kg/m   Physical Exam Physical Exam  Nursing note and vitals reviewed. Constitutional: appears uncomfortable, vomiting in a bag in ED gurney, non-toxic in  appearance Head: Normocephalic and atraumatic.  Mouth/Throat: Oropharynx is clear and moist.  Neck: Normal range of motion. Neck supple.  Cardiovascular: Normal rate and regular rhythm.   Pulmonary/Chest: Effort normal and breath sounds normal.  Abdominal: Soft. There is no tenderness. There is no rebound and no guarding. Left CVA tenderness Musculoskeletal: Normal range of motion.  Neurological: Alert, no facial droop, fluent speech, moves all extremities symmetrically Skin: Skin is warm and dry.  Psychiatric: Cooperative   ED Treatments / Results  Labs (all labs ordered are listed, but only abnormal results are displayed) Labs Reviewed  CBC WITH DIFFERENTIAL/PLATELET - Abnormal; Notable for the following:       Result Value   RBC 5.92 (*)    All other components within normal limits  COMPREHENSIVE METABOLIC PANEL - Abnormal; Notable for the following:    Glucose, Bld 135 (*)    Creatinine, Ser 1.38 (*)    All other components within normal limits  URINALYSIS, ROUTINE W REFLEX MICROSCOPIC (NOT AT [facility]) - Abnormal; Notable for the following:    Color, Urine AMBER (*)    Hgb urine dipstick LARGE (*)    Protein, ur 30 (*)    All other components within normal limits  URINE MICROSCOPIC-ADD ON - Abnormal; Notable for the following:    Squamous Epithelial / LPF 0-5 (*)    Bacteria, UA FEW (*)    All other components within normal limits  LIPASE, BLOOD    EKG  EKG Interpretation None       Radiology [facility]  Result Date: 08/21/2016 CLINICAL DATA:  Left flank pain EXAM: RENAL / URINARY TRACT ULTRASOUND COMPLETE COMPARISON:  03/08/2016 FINDINGS: Right Kidney: Length: 10.7 cm. Echogenicity within normal limits. No mass or hydronephrosis visualized. Left Kidney: Length: 13.2 cm. Caliectasis noted. Stone within the inferior pole of the left kidney measures 7 mm. Echogenicity within normal limits. No mass visualized. Bladder: Appears normal for degree of bladder distention.  IMPRESSION: 1. Left renal calculi. 2. Left-sided pelvocaliectasis. Electronically Signed   By: Signa Kellaylor  Stroud M.D.   On: 08/21/2016 10:46    Procedures Procedures (including critical care time)  Medications Ordered in ED Medications  sodium chloride 0.9 % bolus 1,000 mL (0 mLs Intravenous Stopped 08/21/16 1133)  HYDROmorphone (DILAUDID) injection 1 mg (1 mg Intravenous Given 08/21/16 0939)  ondansetron (ZOFRAN) injection 4 mg (4 mg Intravenous Given 08/21/16 0937)  HYDROmorphone (DILAUDID) injection 1 mg (1 mg Intravenous Given 08/21/16 1202)  ketorolac (TORADOL) 30 MG/ML injection 30 mg (30 mg Intravenous Given 08/21/16 1201)     Initial Impression / Assessment and Plan / ED Course  I have reviewed the triage vital signs and the nursing notes.  Pertinent labs & imaging results that were available during my care of the patient were reviewed by me and considered in my medical decision making (see chart for details).  Clinical Course    History of nephrolithiasis who presents  with reported same symptoms as when he had ureteral stone. Old records reviewed. He has CT renal stone study performed in February 2017 showing bilateral nephrolithiasis as well as left ureteral stone which she states that he had passed. He did not follow up with anybody for this. Given that these are the exact same symptoms that he has had with his ureteral stone will obtain basic blood work, urinalysis and treat as kidney stone. DDX includes urolithiasis, pyelonephritis. Unlikely intraabdominal processes such as diverticulitis, aortic pathology.  Renal ultrasound with left-sided kidney stones and hydronephrosis/dilation of the renal pelvis. Kidney function slightly bumped but still near baseline. No evidence of infection on UA and no leukocytosis or fever. Given IV fluids, antibiotics, and analgesics. On follow-up does have well controlled pain. I feel that he is appropriate for outpatient management. Given urology  follow-up as needed  Strict return and follow-up instructions are reviewed. He expressed understanding of all discharge instructions, and felt comfortable to plan of care.   Final Clinical Impressions(s) / ED Diagnoses   Final diagnoses:  Left ureteral stone    New Prescriptions New Prescriptions   ONDANSETRON (ZOFRAN) 4 MG TABLET    Take 1 tablet (4 mg total) by mouth every 6 (six) hours as needed for nausea or vomiting.   OXYCODONE-ACETAMINOPHEN (PERCOCET/ROXICET) 5-325 MG TABLET    Take 1-2 tablets by mouth every 6 (six) hours as needed for moderate pain or severe pain.   TAMSULOSIN (FLOMAX) 0.4 MG CAPS CAPSULE    Take 1 capsule (0.4 mg total) by mouth daily.     Lavera Guise, MD 08/21/16 [id number]

## 2016-08-21 NOTE — ED Notes (Signed)
Tolerating fluids well.   

## 2016-08-26 ENCOUNTER — Emergency Department (HOSPITAL_COMMUNITY): Payer: Self-pay

## 2016-08-26 ENCOUNTER — Emergency Department (HOSPITAL_COMMUNITY)
Admission: EM | Admit: 2016-08-26 | Discharge: 2016-08-26 | Disposition: A | Discharge status: Home / Self Care | Payer: Self-pay | Attending: Emergency Medicine | Admitting: Emergency Medicine

## 2016-08-26 ENCOUNTER — Encounter (HOSPITAL_COMMUNITY): Payer: Self-pay | Admitting: Emergency Medicine

## 2016-08-26 DIAGNOSIS — N1330 Unspecified hydronephrosis: Secondary | ICD-10-CM | POA: Insufficient documentation

## 2016-08-26 DIAGNOSIS — I10 Essential (primary) hypertension: Secondary | ICD-10-CM | POA: Insufficient documentation

## 2016-08-26 DIAGNOSIS — N179 Acute kidney failure, unspecified: Secondary | ICD-10-CM | POA: Insufficient documentation

## 2016-08-26 DIAGNOSIS — Z79899 Other long term (current) drug therapy: Secondary | ICD-10-CM | POA: Insufficient documentation

## 2016-08-26 DIAGNOSIS — N200 Calculus of kidney: Secondary | ICD-10-CM

## 2016-08-26 DIAGNOSIS — N202 Calculus of kidney with calculus of ureter: Secondary | ICD-10-CM | POA: Insufficient documentation

## 2016-08-26 LAB — BASIC METABOLIC PANEL
ANION GAP: 13 (ref 5–15)
BUN: 16 mg/dL (ref 6–20)
CALCIUM: 9.4 mg/dL (ref 8.9–10.3)
CO2: 25 mmol/L (ref 22–32)
CREATININE: 1.92 mg/dL — AB (ref 0.61–1.24)
Chloride: 99 mmol/L — ABNORMAL LOW (ref 101–111)
GFR, EST AFRICAN AMERICAN: 51 mL/min — AB (ref >60)
GFR, EST NON AFRICAN AMERICAN: 44 mL/min — AB (ref >60)
GLUCOSE: 105 mg/dL — AB (ref 65–99)
Potassium: 3.7 mmol/L (ref 3.5–5.1)
Sodium: 137 mmol/L (ref 135–145)

## 2016-08-26 LAB — URINALYSIS, ROUTINE W REFLEX MICROSCOPIC
BILIRUBIN URINE: NEGATIVE
Glucose, UA: NEGATIVE mg/dL
Hgb urine dipstick: NEGATIVE
KETONES UR: NEGATIVE mg/dL
LEUKOCYTES UA: NEGATIVE
NITRITE: NEGATIVE
PH: 7 (ref 5.0–8.0)
PROTEIN: NEGATIVE mg/dL
Specific Gravity, Urine: 1.005 (ref 1.005–1.030)

## 2016-08-26 LAB — CBC WITH DIFFERENTIAL/PLATELET
BASOS ABS: 0 10*3/uL (ref 0.0–0.1)
BASOS PCT: 1 %
EOS ABS: 0.4 10*3/uL (ref 0.0–0.7)
Eosinophils Relative: 5 %
HEMATOCRIT: 43.9 % (ref 39.0–52.0)
Hemoglobin: 14.4 g/dL (ref 13.0–17.0)
Lymphocytes Relative: 18 %
Lymphs Abs: 1.3 10*3/uL (ref 0.7–4.0)
MCH: 25.9 pg — ABNORMAL LOW (ref 26.0–34.0)
MCHC: 32.8 g/dL (ref 30.0–36.0)
MCV: 79.1 fL (ref 78.0–100.0)
MONO ABS: 0.4 10*3/uL (ref 0.1–1.0)
MONOS PCT: 6 %
NEUTROS ABS: 5.1 10*3/uL (ref 1.7–7.7)
Neutrophils Relative %: 70 %
PLATELETS: 297 10*3/uL (ref 150–400)
RBC: 5.55 MIL/uL (ref 4.22–5.81)
RDW: 13.5 % (ref 11.5–15.5)
WBC: 7.3 10*3/uL (ref 4.0–10.5)

## 2016-08-26 MED ORDER — MORPHINE SULFATE (PF) 4 MG/ML IV SOLN
4.0000 mg | Freq: Once | INTRAVENOUS | Status: AC
  Administered 2016-08-26: 4 mg via INTRAVENOUS
  Filled 2016-08-26: qty 1

## 2016-08-26 MED ORDER — MORPHINE SULFATE (PF) 4 MG/ML IV SOLN
8.0000 mg | Freq: Once | INTRAVENOUS | Status: AC
Start: 2016-08-26 — End: 2016-08-26
  Administered 2016-08-26: 8 mg via INTRAVENOUS
  Filled 2016-08-26: qty 2

## 2016-08-26 MED ORDER — ONDANSETRON HCL 4 MG/2ML IJ SOLN
4.0000 mg | Freq: Once | INTRAMUSCULAR | Status: AC
  Administered 2016-08-26: 4 mg via INTRAVENOUS
  Filled 2016-08-26: qty 2

## 2016-08-26 MED ORDER — OXYCODONE-ACETAMINOPHEN 5-325 MG PO TABS: 1.0000 | ORAL_TABLET | ORAL | 0 refills | Status: DC | PRN

## 2016-08-26 MED ORDER — ONDANSETRON 4 MG PO TBDP: 4.0000 mg | ORAL_TABLET | Freq: Three times a day (TID) | ORAL | 0 refills | Status: DC | PRN

## 2016-08-26 MED ORDER — SODIUM CHLORIDE 0.9 % IV BOLUS (SEPSIS)
1000.0000 mL | Freq: Once | INTRAVENOUS | Status: AC
  Administered 2016-08-26: 1000 mL via INTRAVENOUS

## 2016-08-26 NOTE — ED Triage Notes (Signed)
Pt states he was diagnosed with kidney stones on the 22nd and given pain medication. States the pain is not controlled and came in for better pain control.

## 2016-08-26 NOTE — Discharge Instructions (Signed)
Please follow up with your regular doctor or urology this week to recheck your kidney function.

## 2016-08-26 NOTE — ED Provider Notes (Signed)
[facility]PT Provider Note   CSN: [id number] Arrival date & time: 08/26/16  1478     History   Chief Complaint Chief Complaint  Patient presents with  . Flank Pain    HPI Michael Rich is a 35 y.o. male.  HPI   Patient is a 35 year old male history of hypertension and kidney stones, Michael Rich presents emergency Department with 5 days of gradually worsening left flank pain. Michael Rich was seen in the ER 5 days ago with new onset of left flank pain, Michael Rich had a ultrasound was treated clinically for kidney stones. Michael Rich was given follow-up with urology, Michael Rich has not passed the stone and Michael Rich states that urology told him to follow-up in 2 weeks. Michael Rich was prescribed Percocet and Flomax which she has been taking that Michael Rich states that Michael Rich has had constant and gradually worsening left flank pain, brief hematuria 3 days ago that resolved, nausea vomiting, sweats and chills. Michael Rich had no change in urine output, no fever.   Michael Rich ionly taking  Percocet a day and states that his current pain is 7/10.  Past Medical History:  Diagnosis Date  . Essential hypertension   . Kidney stone     Patient Active Problem List   Diagnosis Date Noted  . GERD (gastroesophageal reflux disease) 10/03/2015  . Essential hypertension 10/17/2014  . 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 No pertinent surgical history.     Home Medications    Prior to Admission medications   Medication Sig Start Date End Date Taking? Authorizing Provider  hydrochlorothiazide (HYDRODIURIL) 25 MG tablet Take 1 tablet (25 mg total) by mouth daily. 10/07/15  Yes Yolande Jolly, MD  ibuprofen (ADVIL,MOTRIN) 200 MG tablet Take 400 mg by mouth every 6 (six) hours as needed (Pain).   Yes Historical Provider, MD  ondansetron (ZOFRAN) 4 MG tablet Take 1 tablet (4 mg total) by mouth every 6 (six) hours as needed for nausea or vomiting. 08/21/16  Yes Lavera Guise, MD  oxyCODONE-acetaminophen (PERCOCET/ROXICET) 5-325 MG tablet Take 1-2 tablets  by mouth every 6 (six) hours as needed for moderate pain or severe pain. 08/21/16  Yes Lavera Guise, MD  tamsulosin (FLOMAX) 0.4 MG CAPS capsule Take 1 capsule (0.4 mg total) by mouth daily. 08/21/16  Yes Lavera Guise, MD  amLODipine (NORVASC) 5 MG tablet Take 2 tablets (10 mg total) by mouth daily. Take 0.5 tablet per day for the first week. Patient not taking: Reported on 08/26/2016 10/03/15   Yolande Jolly, MD  ondansetron (ZOFRAN ODT) 4 MG disintegrating tablet Take 1 tablet (4 mg total) by mouth every 8 (eight) hours as needed for nausea or vomiting. 08/26/16   Danelle Berry, PA-C  oxyCODONE-acetaminophen (PERCOCET) 5-325 MG tablet Take 1-2 tablets by mouth every 4 (four) hours as needed. Patient not taking: Reported on 08/26/2016 03/08/16   Gilda Crease, MD  oxyCODONE-acetaminophen (PERCOCET) 5-325 MG tablet Take 1 tablet by mouth every 4 (four) hours as needed. 08/26/16   Danelle Berry, PA-C  promethazine (PHENERGAN) 25 MG tablet Take 1 tablet (25 mg total) by mouth every 6 (six) hours as needed for nausea or vomiting. Patient not taking: Reported on 08/26/2016 03/08/16   Gilda Crease, MD  tamsulosin (FLOMAX) 0.4 MG CAPS capsule Take 1 capsule (0.4 mg total) by mouth daily. Patient not taking: Reported on 08/26/2016 03/08/16   Gilda Crease, MD    Family History Family History  Problem Relation Age of Onset  .  Hypertension Father     Social History Social History  Substance Use Topics  . Smoking status: Never Smoker  . Smokeless tobacco: Never Used  . Alcohol use No     Allergies   Review of patient's allergies indicates no known allergies.   Review of Systems Review of Systems  All other systems reviewed and are negative.    Physical Exam Updated Vital Signs BP 151/91   Pulse 72   Temp 97.9 F (36.6 C) (Oral)   Resp 18   SpO2 98%   Physical Exam  Constitutional: Michael Rich is oriented to person, place, and time. Michael Rich appears well-developed and well-nourished.  No distress.  HENT:  Head: Normocephalic and atraumatic.  Nose: Nose normal.  Mouth/Throat: Oropharynx is clear and moist. No oropharyngeal exudate.  Eyes: Conjunctivae and EOM are normal. Pupils are equal, round, and reactive to light. Right eye exhibits no discharge. Left eye exhibits no discharge. No scleral icterus.  Neck: Normal range of motion. No JVD present. No tracheal deviation present.  Cardiovascular: Normal rate, regular rhythm, normal heart sounds and intact distal pulses.  Exam reveals no gallop and no friction rub.   No murmur heard. Pulmonary/Chest: Effort normal and breath sounds normal. No respiratory distress. Michael Rich has no wheezes. Michael Rich has no rales. Michael Rich exhibits no tenderness.  Abdominal: Soft. Bowel sounds are normal. Michael Rich exhibits no distension and no mass. There is no tenderness. There is no rebound and no guarding.  Musculoskeletal: Normal range of motion. Michael Rich exhibits no edema.  Neurological: Michael Rich is alert and oriented to person, place, and time. Michael Rich exhibits normal muscle tone. Coordination normal.  Skin: Skin is warm and dry. No rash noted. Michael Rich is not diaphoretic. No erythema. No pallor.  Psychiatric: Michael Rich has a normal mood and affect. His behavior is normal. Judgment and thought content normal.  Nursing note and vitals reviewed.    ED Treatments / Results  Labs (all labs ordered are listed, but only abnormal results are displayed) Labs Reviewed  BASIC METABOLIC PANEL - Abnormal; Notable for the following:       Result Value   Chloride 99 (*)    Glucose, Bld 105 (*)    Creatinine, Ser 1.92 (*)    GFR calc non Af Amer 44 (*)    GFR calc Af Amer 51 (*)    All other components within normal limits  CBC WITH DIFFERENTIAL/PLATELET - Abnormal; Notable for the following:    MCH 25.9 (*)    All other components within normal limits  URINALYSIS, ROUTINE W REFLEX MICROSCOPIC (NOT AT [facility])    EKG  EKG Interpretation None       Radiology Ct Renal Stone Study  Result  Date: 08/26/2016 CLINICAL DATA:  Nephrolithiasis. Persistent left flank pain for 5 days. EXAM: CT ABDOMEN AND PELVIS WITHOUT CONTRAST TECHNIQUE: Multidetector CT imaging of the abdomen and pelvis was performed following the standard protocol without IV contrast. COMPARISON:  03/08/2016 FINDINGS: Lower chest:  Subsegmental bibasilar atelectasis. Hepatobiliary: No focal liver abnormality.No evidence of biliary obstruction or stone. Pancreas: Unremarkable. Spleen: Unremarkable. Adrenals/Urinary Tract:  Negative adrenals. 5 mm stone in the distal left ureter, within 1 cm of the UVJ, with mild to moderate hydroureteronephrosis and periureteric edema. 2 mm calculus in the upper pole right kidney. No right hydronephrosis or ureteral calculus. Unremarkable bladder. Stomach/Bowel: No obstruction. No appendicitis. Moderate stool volume without obstruction Vascular/Lymphatic: Prominent for age atherosclerotic calcification on the aorta. Mild haziness of fat in the small bowel mesentery without  adenopathy or mass, stable and incidental. Reproductive:No pathologic findings. Other: No ascites or pneumoperitoneum. Musculoskeletal: No acute abnormalities. IMPRESSION: 1. 5 mm distal left ureteral calculus causing hydronephrosis. 2. 2 mm right renal calculus. Electronically Signed   By: Marnee Spring M.D.   On: 08/26/2016 08:39    Procedures Procedures (including critical care time)  Medications Ordered in ED Medications  sodium chloride 0.9 % bolus 1,000 mL (0 mLs Intravenous Stopped 08/26/16 0846)  ondansetron (ZOFRAN) injection 4 mg (4 mg Intravenous Given 08/26/16 0726)  morphine 4 MG/ML injection 4 mg (4 mg Intravenous Given 08/26/16 0727)  morphine 4 MG/ML injection 8 mg (8 mg Intravenous Given 08/26/16 0914)     Initial Impression / Assessment and Plan / ED Course  I have reviewed the triage vital signs and the nursing notes.  Pertinent labs & imaging results that were available during my care of the patient  were reviewed by me and considered in my medical decision making (see chart for details).  Clinical Course   Pt with left sided kidney stone, reports worsening pain.  No fever, N, V, well appearing with unremarkable PE. CT scan pertinent for 5mm stone in distal left ureter with mild-moderate hydroureteronephrosis and periureteric edema.  Urine w/o infection, no white count.  Pt tolerating PO's.  Encouraged to contact urology and see sooner.    Final Clinical Impressions(s) / ED Diagnoses   Final diagnoses:  Kidney stone  AKI (acute kidney injury) (HCC)    New Prescriptions New Prescriptions   ONDANSETRON (ZOFRAN ODT) 4 MG DISINTEGRATING TABLET    Take 1 tablet (4 mg total) by mouth every 8 (eight) hours as needed for nausea or vomiting.   OXYCODONE-ACETAMINOPHEN (PERCOCET) 5-325 MG TABLET    Take 1 tablet by mouth every 4 (four) hours as needed.     Danelle Berry, PA-C 08/29/16 1610    Alvira Monday, MD 09/02/16 0002

## 2016-08-27 ENCOUNTER — Telehealth: Payer: Self-pay | Admitting: *Deleted

## 2016-08-27 NOTE — Telephone Encounter (Signed)
[facility] will be accepting new pt on Monday 10/2; advised pt to call on Monday.

## 2016-10-09 ENCOUNTER — Other Ambulatory Visit: Payer: Self-pay | Admitting: *Deleted

## 2016-10-09 DIAGNOSIS — I10 Essential (primary) hypertension: Secondary | ICD-10-CM

## 2016-10-12 MED ORDER — HYDROCHLOROTHIAZIDE 25 MG PO TABS: 25.0000 mg | ORAL_TABLET | Freq: Every day | ORAL | 0 refills | Status: DC

## 2016-11-16 ENCOUNTER — Other Ambulatory Visit: Payer: Self-pay | Admitting: *Deleted

## 2016-11-16 DIAGNOSIS — I10 Essential (primary) hypertension: Secondary | ICD-10-CM

## 2016-11-16 NOTE — Telephone Encounter (Signed)
Patient requesting refill on hctz, patient informed that he will likely need appointment since he hasnt been seen since last year. Patient states he will call back to schedule an appointment.

## 2016-11-17 ENCOUNTER — Other Ambulatory Visit: Payer: Self-pay | Admitting: Family Medicine

## 2016-11-17 DIAGNOSIS — I10 Essential (primary) hypertension: Secondary | ICD-10-CM

## 2016-11-17 MED ORDER — HYDROCHLOROTHIAZIDE 25 MG PO TABS: 25.0000 mg | ORAL_TABLET | Freq: Every day | ORAL | 0 refills | Status: DC

## 2016-11-17 NOTE — Telephone Encounter (Signed)
Pt needs a refill on BP medication. Pt uses [facility] on [address] Please advise. Thanks! ep

## 2016-11-20 NOTE — Telephone Encounter (Signed)
Patient called and states that he was unable to fill script.  Called [facility] and they have 2 scripts ready for him to pick up. Called patient and informed him of this.  Cesareo Vickrey,CMA

## 2017-04-21 IMAGING — US US RENAL
1 series · 14 of 25 positions shown · non-contrast
Comparison: 03/08/2016

CLINICAL DATA: Left flank pain

EXAM:
RENAL / URINARY TRACT ULTRASOUND COMPLETE

[Series 1: us renal · 0.26mm/px · 14 of 35 slices shown]
[im 1/35]
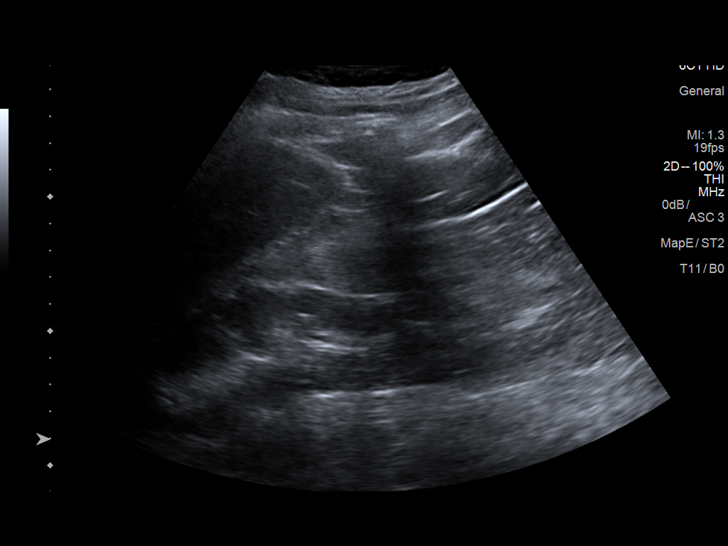
[im 3/35]
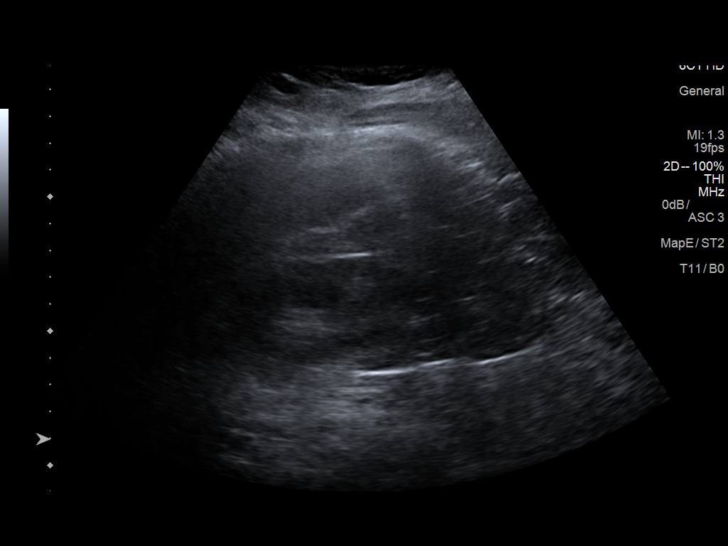
[im 6/35]
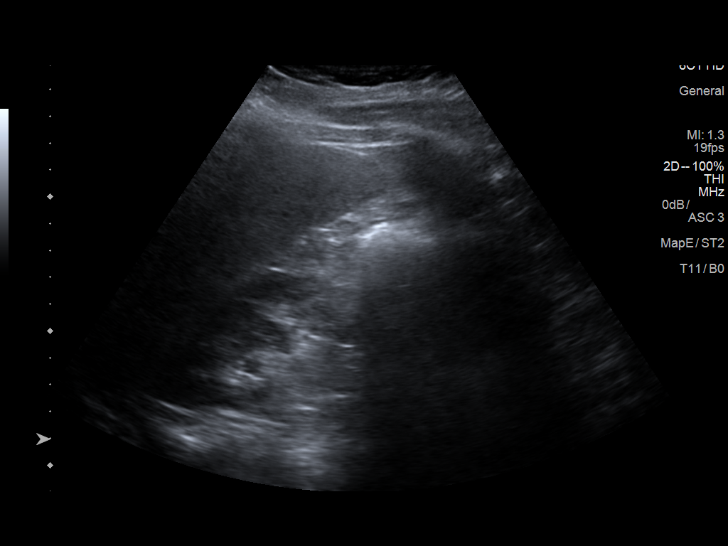
[im 9/35]
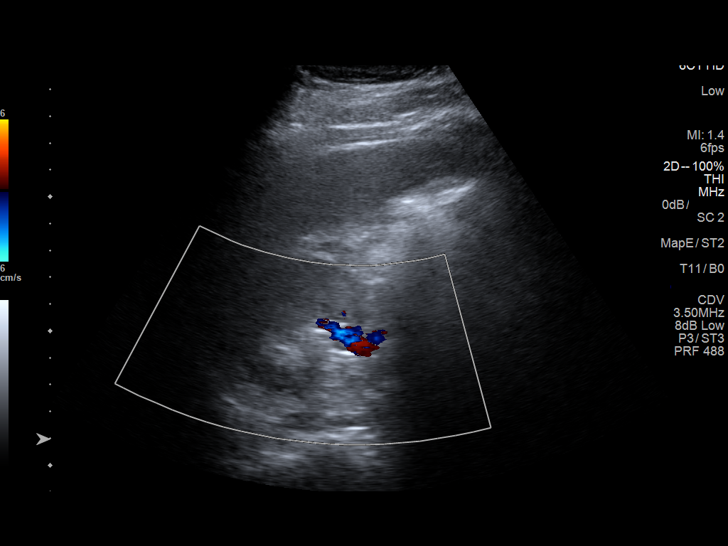
[im 12/35]
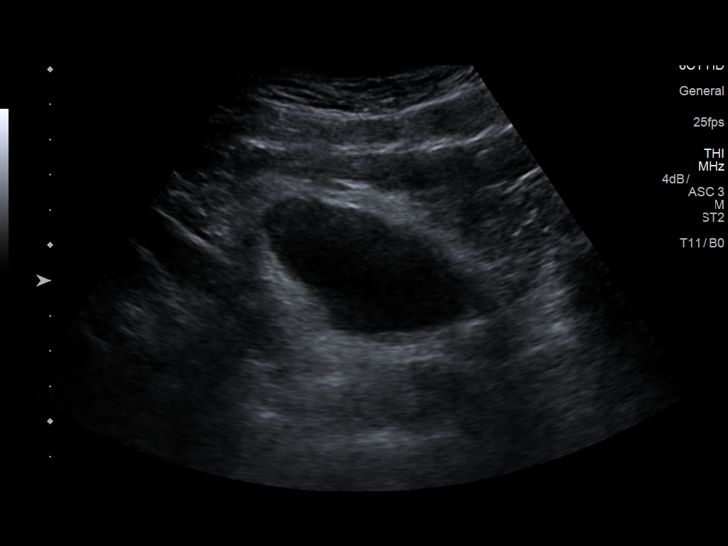
[im 13/35]
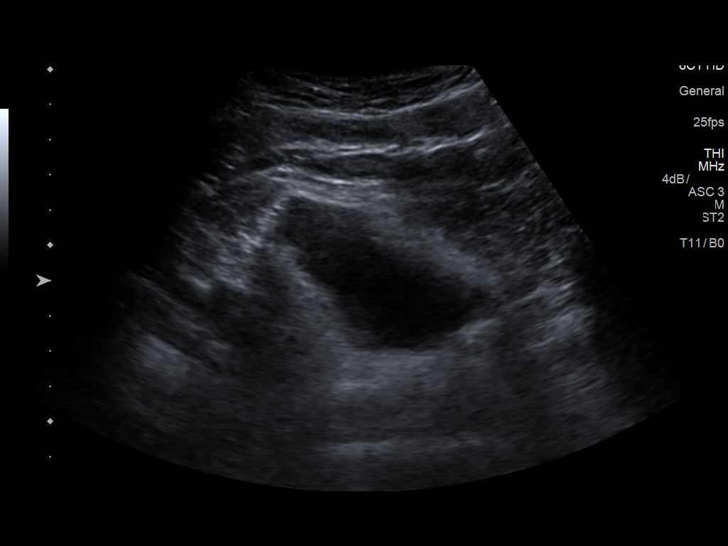
[im 16/35]
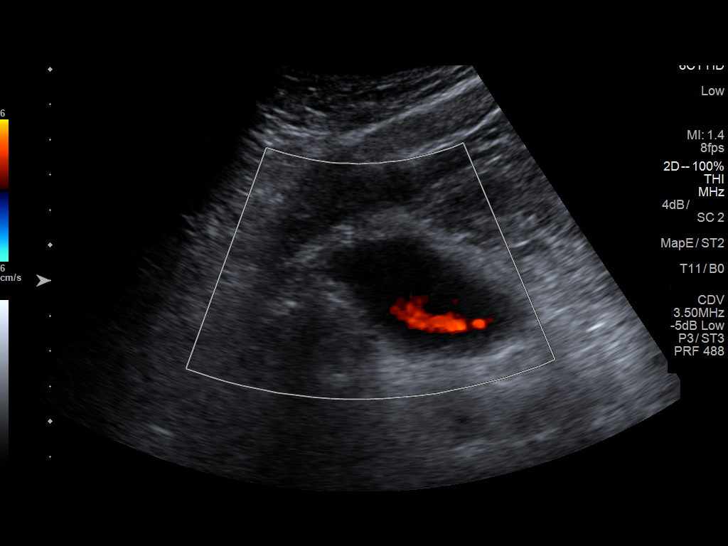
[im 19/35]
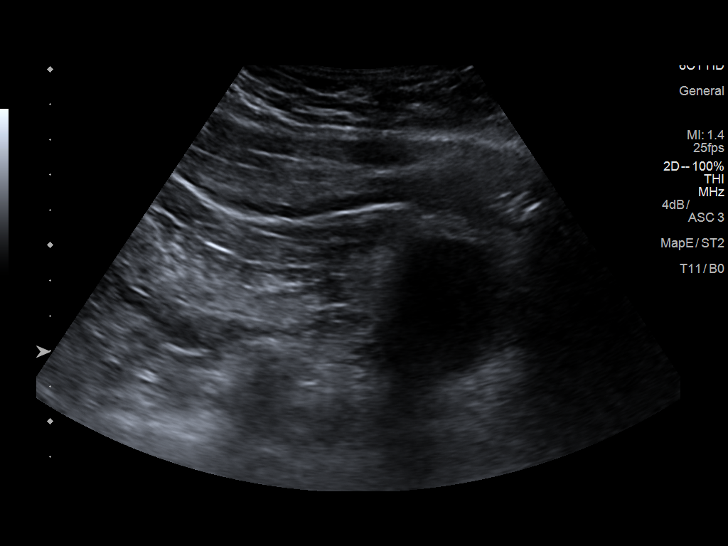
[im 22/35]
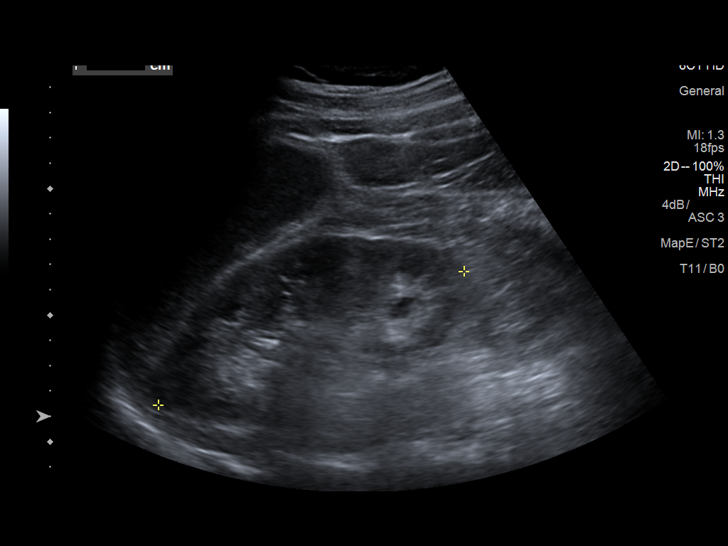
[im 23/35]
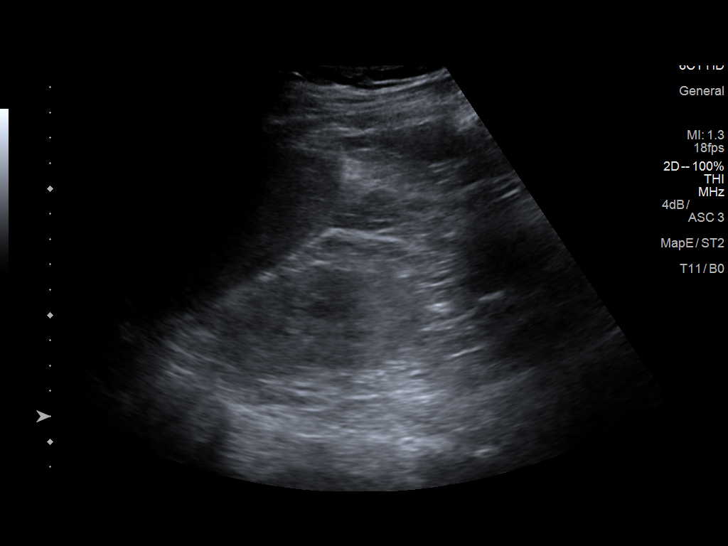
[im 26/35]
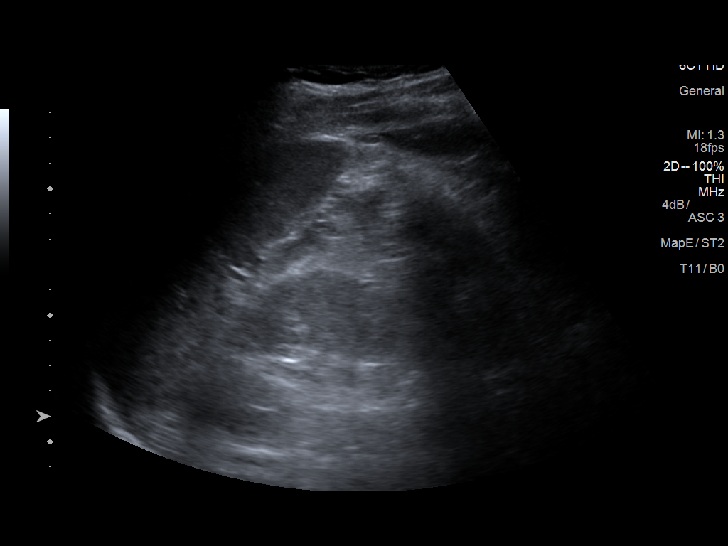
[im 29/35]
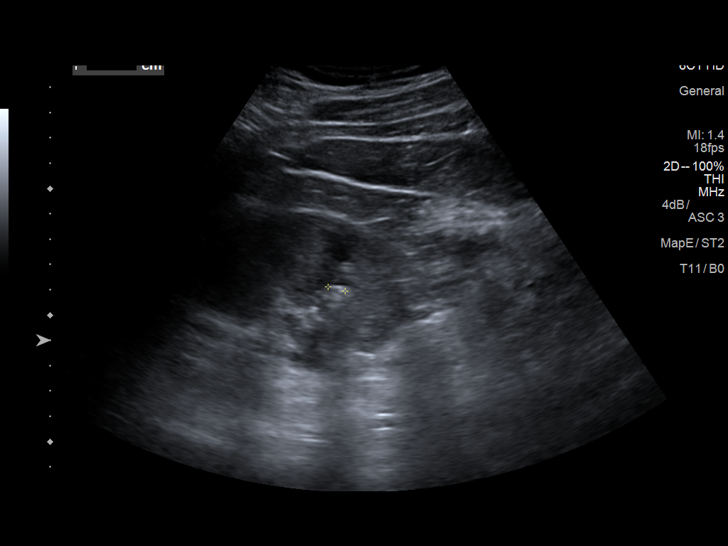
[im 32/35]
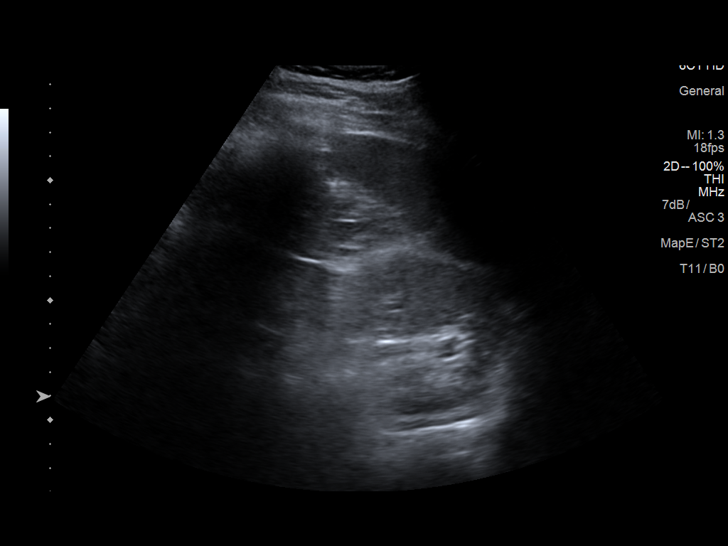
[im 35/35]
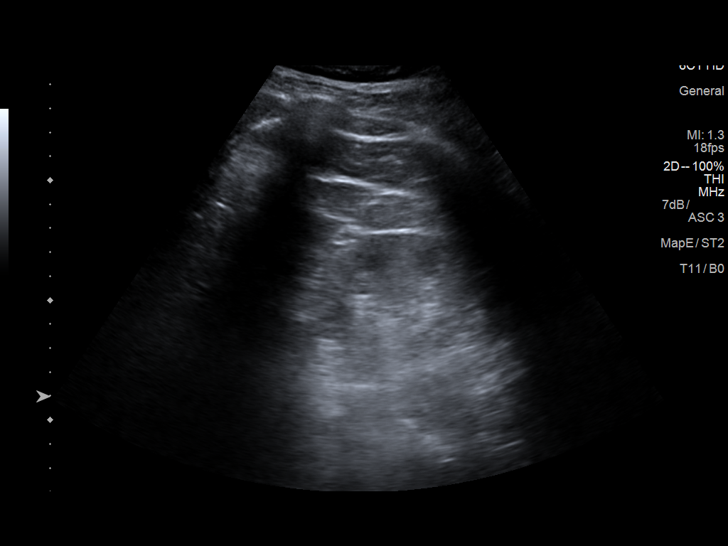

[14 of 25 positions shown; findings below may reference images not displayed]

FINDINGS: Right Kidney:

Length: 10.7 cm. Echogenicity within normal limits. No mass or
hydronephrosis visualized.

Left Kidney:

Length: 13.2 cm. Caliectasis noted. Stone within the inferior pole
of the left kidney measures 7 mm.. Echogenicity within normal
limits. No mass visualized.

Bladder:

Appears normal for degree of bladder distention.
IMPRESSION: 1. Left renal calculi.
2. Left-sided pelvocaliectasis.

## 2020-07-24 ENCOUNTER — Other Ambulatory Visit: Payer: Self-pay | Admitting: Physician Assistant

## 2020-07-24 ENCOUNTER — Ambulatory Visit
Admission: RE | Admit: 2020-07-24 | Discharge: 2020-07-24 | Disposition: A | Discharge status: Home / Self Care | Payer: 59 | Source: Ambulatory Visit | Attending: Physician Assistant | Admitting: Physician Assistant

## 2020-07-24 ENCOUNTER — Other Ambulatory Visit: Payer: Self-pay

## 2020-07-24 DIAGNOSIS — M25532 Pain in left wrist: Secondary | ICD-10-CM

## 2020-07-24 DIAGNOSIS — S134XXA Sprain of ligaments of cervical spine, initial encounter: Secondary | ICD-10-CM

## 2020-08-16 ENCOUNTER — Other Ambulatory Visit: Payer: Self-pay | Admitting: Chiropractor

## 2020-08-16 DIAGNOSIS — M25532 Pain in left wrist: Secondary | ICD-10-CM

## 2020-09-06 ENCOUNTER — Ambulatory Visit
Admission: RE | Admit: 2020-09-06 | Discharge: 2020-09-06 | Disposition: A | Discharge status: Home / Self Care | Payer: Self-pay | Source: Ambulatory Visit | Attending: Chiropractor | Admitting: Chiropractor

## 2020-09-06 ENCOUNTER — Other Ambulatory Visit: Payer: Self-pay

## 2020-09-06 DIAGNOSIS — M25532 Pain in left wrist: Secondary | ICD-10-CM

## 2021-02-18 ENCOUNTER — Ambulatory Visit (INDEPENDENT_AMBULATORY_CARE_PROVIDER_SITE_OTHER): Payer: 59 | Admitting: Allergy and Immunology

## 2021-02-18 ENCOUNTER — Other Ambulatory Visit: Payer: Self-pay

## 2021-02-18 ENCOUNTER — Encounter: Payer: Self-pay | Admitting: Allergy and Immunology

## 2021-02-18 VITALS — BP 150/92 | HR 75 | Temp 99.1°F | Resp 18 | Ht 70.0 in | Wt 276.2 lb

## 2021-02-18 DIAGNOSIS — T50905D Adverse effect of unspecified drugs, medicaments and biological substances, subsequent encounter: Secondary | ICD-10-CM | POA: Diagnosis not present

## 2021-02-18 NOTE — Progress Notes (Signed)
[place] - [place] - [place] - [place] - [place]   Dear Dr. Wynelle Link,  Thank you for referring Michael Rich to the [facility] [place] on 02/18/2021.   Below is a summation of this patient's evaluation and recommendations.  Thank you for your referral. I will keep you informed about this patient's response to treatment.   If you have any questions please do not hesitate to contact me.   Sincerely,  Jessica Priest, MD Allergy / Immunology [place] [facility]   ______________________________________________________________________    NEW PATIENT NOTE  Referring Provider: Deatra James, MD Primary Provider: Melene Plan, MD Date of office visit: 02/18/2021    Subjective:   Chief Complaint:  Michael Rich (DOB: 11/17/1981) is a 40 y.o. male who presents to the clinic on 02/18/2021 with a chief complaint of Allergy Testing .     HPI: Michael Rich presents to this clinic in evaluation of possible anesthetic allergy.  His history dates back to early March at which point in time he had a topical anesthetic administered inside his oral cavity for removal of 2 molars on the left mandible that was also associated with the use of amoxicillin for 7 days starting 1 day prior to his procedure.  The procedure went well but 36 hours later he developed a blistering red painful rash affecting his supraorbital and temporal region on the left.  He was diagnosed with possible shingles and was given appropriate therapy and his reaction resolved within approximately 7 days.  At the end of 7 days he definitely had "scabs".  He had no other associated systemic or constitutional symptoms.  His dentist would like to make sure that it was not topical anesthetic that precipitated this issue.  He does not really have an atopic history other than the fact that he had childhood asthma which completely resolved.  He is on lisinopril  for hypertension.  Past Medical History:  Diagnosis Date  . Essential hypertension   . Kidney stone     History reviewed. No pertinent surgical history.  Allergies as of 02/18/2021   No Known Allergies     Medication List    amLODipine 5 MG tablet Commonly known as: NORVASC Take 2 tablets (10 mg total) by mouth daily. Take 0.5 tablet per day for the first week.   lisinopril 40 MG tablet Commonly known as: ZESTRIL Take 40 mg by mouth daily.   metoprolol succinate 50 MG 24 hr tablet Commonly known as: TOPROL-XL Take 50 mg by mouth daily.       Review of systems negative except as noted in HPI / PMHx or noted below:  Review of Systems  Constitutional: Negative.   HENT: Negative.   Eyes: Negative.   Respiratory: Negative.   Cardiovascular: Negative.   Gastrointestinal: Negative.   Genitourinary: Negative.   Musculoskeletal: Negative.   Skin: Negative.   Neurological: Negative.   Endo/Heme/Allergies: Negative.   Psychiatric/Behavioral: Negative.     Family History  Problem Relation Age of Onset  . Hypertension Father     Social History   Socioeconomic History  . Marital status: Single    Spouse name: Not on file  . Number of children: Not on file  . Years of education: Not on file  . Highest education level: Not on file  Occupational History  . Not on file  Tobacco Use  . Smoking status: Never Smoker  . Smokeless tobacco:  Never Used  Substance and Sexual Activity  . Alcohol use: No    Alcohol/week: 0.0 standard drinks  . Drug use: No  . Sexual activity: Not on file  Other Topics Concern  . Not on file  Social History Narrative  . Not on file   Environmental and Social history   Objective:   Vitals:   02/18/21 0937  BP: (!) 150/92  Pulse: 75  Resp: 18  Temp: 99.1 F (37.3 C)  SpO2: 99%   Height: 5\' 10"  (177.8 cm) Weight: 276 lb 3.2 oz (125.3 kg)  Physical Exam Constitutional:      Appearance: He is not diaphoretic.  HENT:      Head: Normocephalic.     Right Ear: Tympanic membrane, ear canal and external ear normal.     Left Ear: Tympanic membrane, ear canal and external ear normal.     Nose: Nose normal. No mucosal edema or rhinorrhea.     Mouth/Throat:     Pharynx: Uvula midline. No oropharyngeal exudate.  Eyes:     Conjunctiva/sclera: Conjunctivae normal.  Neck:     Thyroid: No thyromegaly.     Trachea: Trachea normal. No tracheal tenderness or tracheal deviation.  Cardiovascular:     Rate and Rhythm: Normal rate and regular rhythm.     Heart sounds: Normal heart sounds, S1 normal and S2 normal. No murmur heard.   Pulmonary:     Effort: No respiratory distress.     Breath sounds: Normal breath sounds. No stridor. No wheezing or rales.  Lymphadenopathy:     Head:     Right side of head: No tonsillar adenopathy.     Left side of head: No tonsillar adenopathy.     Cervical: No cervical adenopathy.  Skin:    Findings: No erythema or rash.     Nails: There is no clubbing.  Neurological:     Mental Status: He is alert.     Diagnostics: Allergy skin tests were not performed.   Assessment and Plan:    1. Adverse effect of drug, subsequent encounter     1.  Arrange for topical anesthetic allergy testing   We will obtain vials of topical anesthetic from [facility]' dentist and have him undergo an incremental challenge with a topical anesthetic to work through the possibility of topical anesthetic allergy.  These vials cannot contain epinephrine which would negate the results of these tests.  It sounds as though he actually had an outbreak of shingles and I did have a talk with him today about obtaining the shingles vaccine series when he is age appropriate.  [facility], MD Allergy / Immunology [facility] [place]

## 2021-02-18 NOTE — Patient Instructions (Addendum)
  1.  Arrange for topical anesthetic allergy testing

## 2021-02-19 ENCOUNTER — Encounter: Payer: Self-pay | Admitting: Allergy and Immunology

## 2021-02-20 ENCOUNTER — Telehealth: Payer: Self-pay

## 2021-02-20 NOTE — Telephone Encounter (Signed)
-----   Message from Jessica Priest, MD sent at 02/19/2021  7:04 AM EDT ----- Please contact patient's dentist.  We need to vials of the topical anesthetic the dentist would like to use on Michael Rich.  These vials cannot contain epinephrine.  Once we have those vials we can contact [name] to come in for topical anesthetic testing.

## 2021-02-20 NOTE — Telephone Encounter (Signed)
Left a voicemail to return the call.  Inquiring the name of his dentist.

## 2021-02-21 ENCOUNTER — Telehealth: Payer: Self-pay

## 2021-02-21 NOTE — Telephone Encounter (Signed)
-----   Message from Eric J Kozlow, MD sent at 02/19/2021  7:04 AM EDT ----- Please contact patient's dentist.  We need to vials of the topical anesthetic the dentist would like to use on Michael Rich.  These vials cannot contain epinephrine.  Once we have those vials we can contact [name] to come in for topical anesthetic testing.  

## 2021-02-21 NOTE — Telephone Encounter (Signed)
Called and left a message for dentist to call our office back. They are closed on Friday's.

## 2021-02-21 NOTE — Telephone Encounter (Signed)
Nurse from dentist office called back she states she will talk to provider on Monday and will arrange everything for [place] to get those vials. They will be giving [place] a call back Monday as well.

## 2021-02-21 NOTE — Telephone Encounter (Signed)
Left a message for patient to return the call to the call to ask the name of his dentist.

## 2021-02-21 NOTE — Telephone Encounter (Signed)
Patient called back and the name of his dentist is Dr. Darl Pikes DeShield-Mayes at [facility], [address], [place]. Phone #:[phone]

## 2021-02-24 NOTE — Telephone Encounter (Signed)
Called and spoke with Silvio Pate at [employer] and she stated that she does have the vials for pickup to test the patient and that the dentist would like for someone from our office to pick up the vials for testing. Advised that will speak with [facility] and call her back with an update at [phone]. Janelys verbalized understanding.

## 2021-02-27 NOTE — Telephone Encounter (Signed)
Called and spoke with [facility] and they are wanting [place] to test for Lidocaine and Cipanest. Their Lidocaine has epinephrine in it and our in office does not. Their Cipanest does not contain epinephrine. I advised that I will be by today or Monday to pick up their Cipanest and we will use our Lidocaine in office for the challenge since it does not contain epinephrine. Janelys verbalized understanding.

## 2021-03-03 NOTE — Telephone Encounter (Signed)
Patient has been scheduled for a Lidocaine challenge.

## 2021-03-03 NOTE — Telephone Encounter (Signed)
Went to [employer] [facility] today and picked up the Lidocaine and Cipanest. Called and spoke with Dr. Lucie Leather and he would like for the patient to do the Lidocaine challenge first. Called and left a voicemail for the patient asking to return call to schedule for a Lidocaine challenge with either [facility] Hole or [employer]

## 2021-03-18 ENCOUNTER — Encounter: Payer: Self-pay | Admitting: Family

## 2021-03-18 ENCOUNTER — Other Ambulatory Visit: Payer: Self-pay

## 2021-03-18 ENCOUNTER — Ambulatory Visit (INDEPENDENT_AMBULATORY_CARE_PROVIDER_SITE_OTHER): Payer: 59 | Admitting: Family

## 2021-03-18 VITALS — BP 126/86 | HR 61 | Temp 98.4°F | Resp 20

## 2021-03-18 DIAGNOSIS — T50995D Adverse effect of other drugs, medicaments and biological substances, subsequent encounter: Secondary | ICD-10-CM

## 2021-03-18 DIAGNOSIS — T50905D Adverse effect of unspecified drugs, medicaments and biological substances, subsequent encounter: Secondary | ICD-10-CM

## 2021-03-18 NOTE — Progress Notes (Addendum)
[address] [place] [address] Dept: [phone]  FOLLOW UP NOTE  Patient ID: Michael Rich, male    DOB: 08/16/1981  Age: 40 y.o. MRN: [id number] Date of Office Visit: 03/18/2021  Assessment  Chief Complaint: Food/Drug Challenge (Lidocaine)  HPI Michael Rich is a 40 year old male who presents today for lidocaine challenge.  He was last seen on February 18, 2021 by Dr. Lucie Leather for adverse effect of drug, subsequent encounter.  Today he reports that he is in good health and denies any cardiorespiratory, gastrointestinal, and cutaneous symptoms.  He has been off all antihistamines for the past 3 days.  Informed consent signed.  He reports that in early March she had a dental procedure requiring a topical anesthetic and during that time he also started amoxicillin for 7 days 1 day prior to his procedure.  36 hours later he developed blistering red painful rash affecting his supraorbital and temporal region on the left.  He was diagnosed with possible shingles and given the appropriate therapy.  His reaction resolved within 7 days.  At the end of the 7 days he noticed scabbing.  He denied any other associated systemic symptoms.   Drug Allergies:  No Known Allergies  Review of Systems: Review of Systems  Constitutional: Negative for chills and fever.  HENT:       Denies rhinorrhea, nasal congestion, postnasal drip  Eyes:       Denies itchy watery eyes  Respiratory: Negative for cough, shortness of breath and wheezing.   Cardiovascular: Negative for chest pain and palpitations.  Gastrointestinal: Negative for heartburn.  Genitourinary: Negative for dysuria.  Skin: Negative for itching and rash.  Neurological: Negative for headaches.    Physical Exam: BP 126/86   Pulse 61   Temp 98.4 F (36.9 C) (Temporal)   Resp 20   SpO2 95%    Physical Exam Constitutional:      Appearance: Normal appearance.  HENT:     Head: Normocephalic and atraumatic.     Comments:  Pharynx normal, eyes normal, ears unable to visualize left tympanic membrane due to cerumen.  Right ear normal, nose bilateral lower turbinates moderately edematous and slightly erythematous with clear drainage noted.    Right Ear: Tympanic membrane, ear canal and external ear normal.     Left Ear: Ear canal and external ear normal.     Ears:     Comments: Unable to visualize left tympanic membrane due to cerumen    Mouth/Throat:     Mouth: Mucous membranes are moist.     Pharynx: Oropharynx is clear.  Eyes:     Conjunctiva/sclera: Conjunctivae normal.  Cardiovascular:     Rate and Rhythm: Regular rhythm.     Heart sounds: Normal heart sounds.  Pulmonary:     Effort: Pulmonary effort is normal.     Breath sounds: Normal breath sounds.     Comments: Lungs clear to auscultation Musculoskeletal:     Cervical back: Neck supple.  Skin:    General: Skin is warm.     Comments: No urticarial lesions or rash noted.  Minimal scarring noted along the outside of the left orbital and temporal region  Neurological:     Mental Status: He is alert and oriented to person, place, and time.  Psychiatric:        Mood and Affect: Mood normal.        Behavior: Behavior normal.        Thought Content: Thought content normal.  Judgment: Judgment normal.     Diagnostics:    Topical Anesthetic - 03/18/21 1553     Agent Lidocaine    Manufacturer [employer]    Lot # [id number]    Location Arm    Number of allergen test 10    Time Testing Placed 0152    Control SPT Negative    Histamine SPT 2+    Full Strength Anesthetic SPT Negative    Time Testing Placed 0213    Saline Control Intradermal Negative    1/100 Anesthetic Intradermal Negative    Time Testing Placed 0229    1/10 Anesthetic Intradermal Negative    Time Testing Placed 0245    Full Strength Anesthetic Intradermal Negative    Time Testing Placed 0316    0.1 Full Strength Anesthetic Subcutaneous Negative    Time  Testing Placed 0335    0.5 Full Strength Anesthetic Subcutaneous Negative    Time Testing Placed 0353    1.0 Full Strength Anesthetic Subcutaneous Negative              Assessment and Plan: 1. Adverse effect of drug, subsequent encounter     No orders of the defined types were placed in this encounter.   Patient Instructions  Shirl was able to tolerate the lidocaine challenge today at the office without adverse signs or symptoms of an allergic reaction.  - Do not give any lidocaine products for the next 24 hours. - Monitor for allergic symptoms such as rash, wheezing, diarrhea, swelling, and vomiting for the next 24 hours. If severe symptoms occur,  call 911. For less severe symptoms treat with Benadryl 4 teaspoonfuls every 6 hours and call the clinic.   Your blood pressure was elevated while in the office today. Schedule an appointment with your primary care physician to discuss.  Please let us know if this treatment plan is not working well for you.  Schedule a follow up appointment as needed   Return if symptoms worsen or fail to improve.    Thank you for the opportunity to care for this patient.  Please do not hesitate to contact me with questions.  Nehemiah Settle, FNP [facility]  I have provided oversight concerning Thermon Leyland' evaluation and treatment of this patient's health issues addressed during today's encounter. I agree with the assessment and therapeutic plan as outlined in the note.   Signed,   Jessica Priest, MD,  Allergy and Immunology,  [facility] [place].

## 2021-03-18 NOTE — Patient Instructions (Addendum)
Michael Rich was able to tolerate the lidocaine challenge today at the office without adverse signs or symptoms of an allergic reaction.  - Do not give any lidocaine products for the next 24 hours. - Monitor for allergic symptoms such as rash, wheezing, diarrhea, swelling, and vomiting for the next 24 hours. If severe symptoms occur,  call 911. For less severe symptoms treat with Benadryl 4 teaspoonfuls every 6 hours and call the clinic.   Your blood pressure was elevated while in the office today. Schedule an appointment with your primary care physician to discuss.  Please let us know if this treatment plan is not working well for you.  Schedule a follow up appointment as needed

## 2021-04-30 ENCOUNTER — Telehealth: Payer: Self-pay | Admitting: Family

## 2021-04-30 NOTE — Telephone Encounter (Signed)
Notes from 03/18/2021 have been faxed to [facility] at [phone].

## 2021-04-30 NOTE — Telephone Encounter (Signed)
Please fax over my last office  visit from 03/18/21 to the number above. Thank you!

## 2021-04-30 NOTE — Telephone Encounter (Signed)
[facility] is stating is still waiting for a clearance letter for [place] so they can place the patient under anesthesia (needs to make sure no allergies exist that would interfere with this process )  [facility] stating allergy and asthma is faxing to the wrong number correct number is [phone] please advise

## 2022-09-29 ENCOUNTER — Other Ambulatory Visit: Payer: Self-pay | Admitting: Gastroenterology

## 2022-09-29 DIAGNOSIS — R1030 Lower abdominal pain, unspecified: Secondary | ICD-10-CM

## 2022-10-06 ENCOUNTER — Ambulatory Visit
Admission: RE | Admit: 2022-10-06 | Discharge: 2022-10-06 | Disposition: A | Discharge status: Home / Self Care | Payer: Commercial Managed Care - HMO | Source: Ambulatory Visit | Attending: Gastroenterology | Admitting: Gastroenterology

## 2022-10-06 DIAGNOSIS — R1030 Lower abdominal pain, unspecified: Secondary | ICD-10-CM

## 2022-10-06 MED ORDER — IOPAMIDOL (ISOVUE-300) INJECTION 61%
100.0000 mL | Freq: Once | INTRAVENOUS | Status: AC | PRN
  Administered 2022-10-06: 100 mL via INTRAVENOUS

## 2023-07-08 ENCOUNTER — Other Ambulatory Visit: Payer: Self-pay | Admitting: Surgery

## 2023-07-08 DIAGNOSIS — K439 Ventral hernia without obstruction or gangrene: Secondary | ICD-10-CM

## 2023-07-27 ENCOUNTER — Ambulatory Visit: Admission: RE | Admit: 2023-07-27 | Payer: Commercial Managed Care - HMO | Source: Ambulatory Visit

## 2023-07-27 DIAGNOSIS — K439 Ventral hernia without obstruction or gangrene: Secondary | ICD-10-CM

## 2023-07-27 MED ORDER — IOPAMIDOL (ISOVUE-300) INJECTION 61%
500.0000 mL | Freq: Once | INTRAVENOUS | Status: AC | PRN
  Administered 2023-07-27: 100 mL via INTRAVENOUS

## 2023-08-05 ENCOUNTER — Encounter (HOSPITAL_COMMUNITY): Payer: Self-pay | Admitting: Emergency Medicine

## 2023-08-05 ENCOUNTER — Encounter: Payer: Self-pay | Admitting: Surgery

## 2023-08-05 ENCOUNTER — Emergency Department (HOSPITAL_COMMUNITY)
Admission: EM | Admit: 2023-08-05 | Discharge: 2023-08-06 | Disposition: A | Discharge status: Home / Self Care | Payer: Self-pay | Attending: Emergency Medicine | Admitting: Emergency Medicine

## 2023-08-05 ENCOUNTER — Other Ambulatory Visit: Payer: Self-pay

## 2023-08-05 DIAGNOSIS — I10 Essential (primary) hypertension: Secondary | ICD-10-CM | POA: Insufficient documentation

## 2023-08-05 DIAGNOSIS — R1084 Generalized abdominal pain: Secondary | ICD-10-CM | POA: Insufficient documentation

## 2023-08-05 DIAGNOSIS — Z79899 Other long term (current) drug therapy: Secondary | ICD-10-CM | POA: Insufficient documentation

## 2023-08-05 LAB — URINALYSIS, ROUTINE W REFLEX MICROSCOPIC
Bilirubin Urine: NEGATIVE
Glucose, UA: NEGATIVE mg/dL
Hgb urine dipstick: NEGATIVE
Ketones, ur: 5 mg/dL — AB
Leukocytes,Ua: NEGATIVE
Nitrite: NEGATIVE
Protein, ur: NEGATIVE mg/dL
Specific Gravity, Urine: 1.018 (ref 1.005–1.030)
pH: 5 (ref 5.0–8.0)

## 2023-08-05 LAB — COMPREHENSIVE METABOLIC PANEL
ALT: 42 U/L (ref 0–44)
AST: 27 U/L (ref 15–41)
Albumin: 3.8 g/dL (ref 3.5–5.0)
Alkaline Phosphatase: 58 U/L (ref 38–126)
Anion gap: 15 (ref 5–15)
BUN: 13 mg/dL (ref 6–20)
CO2: 25 mmol/L (ref 22–32)
Calcium: 9.3 mg/dL (ref 8.9–10.3)
Chloride: 96 mmol/L — ABNORMAL LOW (ref 98–111)
Creatinine, Ser: 1.09 mg/dL (ref 0.61–1.24)
GFR, Estimated: >60 mL/min (ref >60)
Glucose, Bld: 110 mg/dL — ABNORMAL HIGH (ref 70–99)
Potassium: 3.4 mmol/L — ABNORMAL LOW (ref 3.5–5.1)
Sodium: 136 mmol/L (ref 135–145)
Total Bilirubin: 1.2 mg/dL (ref 0.3–1.2)
Total Protein: 7.3 g/dL (ref 6.5–8.1)

## 2023-08-05 LAB — CBC
HCT: 47.7 % (ref 39.0–52.0)
Hemoglobin: 15.4 g/dL (ref 13.0–17.0)
MCH: 25.3 pg — ABNORMAL LOW (ref 26.0–34.0)
MCHC: 32.3 g/dL (ref 30.0–36.0)
MCV: 78.3 fL — ABNORMAL LOW (ref 80.0–100.0)
Platelets: 364 10*3/uL (ref 150–400)
RBC: 6.09 MIL/uL — ABNORMAL HIGH (ref 4.22–5.81)
RDW: 13.8 % (ref 11.5–15.5)
WBC: 7.3 10*3/uL (ref 4.0–10.5)
nRBC: 0 % (ref 0.0–0.2)

## 2023-08-05 LAB — LIPASE, BLOOD: Lipase: 18 U/L (ref 11–51)

## 2023-08-05 NOTE — ED Triage Notes (Signed)
Pt reports emesis with centralized abdominal pain for several days. No fever or chills.  Has not had a bowel movement. Feels "bloated" like he has eaten a really large meal.

## 2023-08-06 ENCOUNTER — Emergency Department (HOSPITAL_COMMUNITY): Payer: Self-pay

## 2023-08-06 MED ORDER — IOHEXOL 350 MG/ML SOLN
75.0000 mL | Freq: Once | INTRAVENOUS | Status: AC | PRN
  Administered 2023-08-06: 75 mL via INTRAVENOUS

## 2023-08-06 MED ORDER — ONDANSETRON HCL 4 MG/2ML IJ SOLN
4.0000 mg | Freq: Once | INTRAMUSCULAR | Status: AC
  Administered 2023-08-06: 4 mg via INTRAVENOUS
  Filled 2023-08-06: qty 2

## 2023-08-06 MED ORDER — FENTANYL CITRATE PF 50 MCG/ML IJ SOSY
50.0000 ug | PREFILLED_SYRINGE | Freq: Once | INTRAMUSCULAR | Status: AC
  Administered 2023-08-06: 50 ug via INTRAVENOUS
  Filled 2023-08-06: qty 1

## 2023-08-06 MED ORDER — SODIUM CHLORIDE 0.9 % IV BOLUS (SEPSIS)
1000.0000 mL | Freq: Once | INTRAVENOUS | Status: AC
  Administered 2023-08-06: 1000 mL via INTRAVENOUS

## 2023-08-06 MED ORDER — KETOROLAC TROMETHAMINE 15 MG/ML IJ SOLN
15.0000 mg | Freq: Once | INTRAMUSCULAR | Status: AC
  Administered 2023-08-06: 15 mg via INTRAVENOUS
  Filled 2023-08-06: qty 1

## 2023-08-06 NOTE — Discharge Instructions (Signed)

## 2023-08-06 NOTE — ED Provider Notes (Signed)
[place] EMERGENCY DEPARTMENT AT [facility] Provider Note   CSN: [id number] Arrival date & time: 08/05/23  2214     History  Chief Complaint  Patient presents with   Abdominal Pain   Emesis    Michael Rich is a 42 y.o. male.  The history is provided by the patient.   Patient with history of hypertension presents with nausea vomiting and abdominal pain. Patient reports for the past 5 days he has had nonbloody emesis and generalized abdominal pain.  He reports constipation but was able to pass a small bowel movement yesterday. This has not been related to eating. Patient recently found to have abdominal hernia and is contemplating outpatient surgery Denies previous abdominal surgery    Home Medications Prior to Admission medications   Medication Sig Start Date End Date Taking? Authorizing Provider  amLODipine (NORVASC) 5 MG tablet Take 2 tablets (10 mg total) by mouth daily. Take 0.5 tablet per day for the first week. 10/03/15   Melancon, Hillery Hunter, MD  lisinopril (ZESTRIL) 40 MG tablet Take 40 mg by mouth daily. 01/21/21   [provider]  metoprolol succinate (TOPROL-XL) 50 MG 24 hr tablet Take 50 mg by mouth daily. 01/21/21   [provider]      Allergies    Patient has no known allergies.    Review of Systems   Review of Systems  Constitutional:  Negative for fever.  Respiratory:  Negative for shortness of breath.   Cardiovascular:  Negative for chest pain.  Gastrointestinal:  Positive for abdominal pain.  Genitourinary:  Negative for dysuria.    Physical Exam Updated Vital Signs BP (!) 162/82   Pulse 92   Temp 97.6 F (36.4 C) (Oral)   Resp 19   SpO2 94%  Physical Exam CONSTITUTIONAL: Well developed/well nourished HEAD: Normocephalic/atraumatic EYES: EOMI/PERRL, no icterus ENMT: Mucous membranes moist NECK: supple no meningeal signs CV: S1/S2 noted, no murmurs/rubs/gallops noted LUNGS: Lungs are clear to auscultation  bilaterally, no apparent distress ABDOMEN: soft, obese Bowel sounds noted throughout abdomen.  There are no hernias palpated Tenderness noted in the right upper quadrant epigastric region NEURO: Pt is awake/alert/appropriate, moves all extremitiesx4.  No facial droop.   EXTREMITIES: pulses normal/equal, full ROM SKIN: warm, color normal PSYCH: no abnormalities of mood noted, alert and oriented to situation  ED Results / Procedures / Treatments   Labs (all labs ordered are listed, but only abnormal results are displayed) Labs Reviewed  COMPREHENSIVE METABOLIC PANEL - Abnormal; Notable for the following components:      Result Value   Potassium 3.4 (*)    Chloride 96 (*)    Glucose, Bld 110 (*)    All other components within normal limits  CBC - Abnormal; Notable for the following components:   RBC 6.09 (*)    MCV 78.3 (*)    MCH 25.3 (*)    All other components within normal limits  URINALYSIS, ROUTINE W REFLEX MICROSCOPIC - Abnormal; Notable for the following components:   Ketones, ur 5 (*)    All other components within normal limits  LIPASE, BLOOD    EKG None  Radiology CT ABDOMEN PELVIS W CONTRAST  Result Date: 08/06/2023 CLINICAL DATA:  Abdominal pain, acute, nonlocalized. Emesis since Saturday. EXAM: CT ABDOMEN AND PELVIS WITH CONTRAST TECHNIQUE: Multidetector CT imaging of the abdomen and pelvis was performed using the standard protocol following bolus administration of intravenous contrast. RADIATION DOSE REDUCTION: This exam was performed according to the  departmental dose-optimization program which includes automated exposure control, adjustment of the mA and/or kV according to patient size and/or use of iterative reconstruction technique. CONTRAST:  75mL OMNIPAQUE IOHEXOL 350 MG/ML SOLN COMPARISON:  07/27/2023 FINDINGS: Lower chest: Linear opacity in the lower lungs attributed atelectasis. Hepatobiliary: No focal liver abnormality.No evidence of biliary obstruction or  stone. Pancreas: Unremarkable. Spleen: Unremarkable. Adrenals/Urinary Tract: Negative adrenals. No hydronephrosis or ureteral stone. Two left renal calculi measuring up to 6 mm at the lower pole. Punctate right upper pole renal calculus. Unremarkable bladder. Stomach/Bowel: Diffuse fluid levels within small and large bowel, reaching the rectum. No obstructive pattern or bowel wall thickening. No appendicitis. Vascular/Lymphatic: No acute vascular abnormality. Premature atheromatous calcification of the aorta. Prominent mesenteric lymph nodes with hazy margination, chronic when compared to prior. Reproductive:No pathologic findings. Other: No ascites or pneumoperitoneum. Small midline hernia containing a loop of small bowel. Musculoskeletal: No acute abnormalities. IMPRESSION: 1. Diffuse small and large bowel fluid levels suggesting enteritis and diarrheal state. No bowel wall thickening or obstruction. 2. Left more than right nephrolithiasis. 3. Small midline abdominal wall hernia. Electronically Signed   By: Tiburcio Pea M.D.   On: 08/06/2023 05:13   DG Abdomen Acute W/Chest  Result Date: 08/06/2023 CLINICAL DATA:  Abdominal pain, vomiting EXAM: DG ABDOMEN ACUTE WITH 1 VIEW CHEST COMPARISON:  CT 07/27/2023 FINDINGS: Linear densities at the left base, likely atelectasis. Minimal right basilar atelectasis. Heart is normal size. No effusions. Mildly dilated left abdominal small bowel loops, new since prior study. Gas within decompressed colon. Findings concerning for small bowel obstruction. No organomegaly or free air. IMPRESSION: Dilated left abdominal small bowel loops concerning for small bowel obstruction. Bibasilar atelectasis. Electronically Signed   By: Charlett Nose M.D.   On: 08/06/2023 02:33    Procedures Procedures    Medications Ordered in ED Medications  ketorolac (TORADOL) 15 MG/ML injection 15 mg (has no administration in time range)  ondansetron (ZOFRAN) injection 4 mg (4 mg Intravenous  Given 08/06/23 0229)  fentaNYL (SUBLIMAZE) injection 50 mcg (50 mcg Intravenous Given 08/06/23 0229)  sodium chloride 0.9 % bolus 1,000 mL (0 mLs Intravenous Stopped 08/06/23 0316)  iohexol (OMNIPAQUE) 350 MG/ML injection 75 mL (75 mLs Intravenous Contrast Given 08/06/23 0426)    ED Course/ Medical Decision Making/ A&P Clinical Course as of 08/06/23 0536  Fri Aug 06, 2023  0209 Patient presents with abdominal pain and vomiting for the past several days.  Recently found to have small periumbilical hernia.  There are no signs of incarcerated hernia on clinical exam.  There is no active vomiting at this time.  Labs are overall reassuring.  Discussion of plan with patient, will start with acute abdominal series to evaluate for any bowel obstruction, treat pain and nausea and reassess. [DW]  0413 AAS shows SBO.  Will proceed with CT imaging [DW]  0533 CT imaging does not reveal any acute process, no signs of obstruction.  Does have signs of gastroenteritis.  Patient does report now he had recent loose stool  I spoke to radiologist Dr. Grace Isaac, and he confirmed there is no change in his hernia, no signs of obstruction [DW]  0533 Overall patient feels improved, he is in no acute distress.  He already has plans to follow-up with general surgery as an outpatient Patient be discharged home [DW]    Clinical Course User Index [DW] Zadie Rhine, MD  Medical Decision Making Amount and/or Complexity of Data Reviewed Labs: ordered. Radiology: ordered. ECG/medicine tests: ordered.  Risk Prescription drug management.   This patient presents to the ED for concern of abdominal pain, this involves an extensive number of treatment options, and is a complaint that carries with it a high risk of complications and morbidity.  The differential diagnosis includes but is not limited to cholecystitis, cholelithiasis, pancreatitis, gastritis, peptic ulcer disease, appendicitis, bowel  obstruction, bowel perforation, diverticulitis, AAA, ischemic bowel, acute coronary syndrome   Comorbidities that complicate the patient evaluation: Patient's presentation is complicated by their history of hypertension  Additional history obtained: Records reviewed Care Everywhere/External Records  Lab Tests: I Ordered, and personally interpreted labs.  The pertinent results include: Labs overall unremarkable  Imaging Studies ordered: I ordered imaging studies including X-ray acute abdominal series   I independently visualized and interpreted imaging which showed possible bowel obstruction, but CT imaging negative I agree with the radiologist interpretation   Medicines ordered and prescription drug management: I ordered medication including antiemetics, analgesics for pain and nausea Reevaluation of the patient after these medicines showed that the patient    improved   Consultations Obtained: I requested consultation with the radiologist Dr. Grace Isaac , and discussed  findings as well as pertinent plan - they recommend: No bowel obstruction  Reevaluation: After the interventions noted above, I reevaluated the patient and found that they have :improved  Complexity of problems addressed: Patient's presentation is most consistent with  acute presentation with potential threat to life or bodily function  Disposition: After consideration of the diagnostic results and the patient's response to treatment,  I feel that the patent would benefit from discharge   .           Final Clinical Impression(s) / ED Diagnoses Final diagnoses:  Generalized abdominal pain    Rx / DC Orders ED Discharge Orders     None         Zadie Rhine, MD 08/06/23 [phone]

## 2023-08-06 NOTE — ED Notes (Signed)
EKG done. But is not crossing over.

## 2023-08-09 ENCOUNTER — Ambulatory Visit: Payer: Self-pay | Admitting: Surgery

## 2023-08-28 ENCOUNTER — Other Ambulatory Visit: Payer: Self-pay

## 2023-08-28 ENCOUNTER — Emergency Department (HOSPITAL_COMMUNITY)
Admission: EM | Admit: 2023-08-28 | Discharge: 2023-08-28 | Disposition: A | Discharge status: Home / Self Care | Payer: Self-pay | Attending: Emergency Medicine | Admitting: Emergency Medicine

## 2023-08-28 ENCOUNTER — Emergency Department (HOSPITAL_COMMUNITY): Payer: Self-pay

## 2023-08-28 ENCOUNTER — Encounter (HOSPITAL_COMMUNITY): Payer: Self-pay

## 2023-08-28 DIAGNOSIS — K429 Umbilical hernia without obstruction or gangrene: Secondary | ICD-10-CM

## 2023-08-28 DIAGNOSIS — K56609 Unspecified intestinal obstruction, unspecified as to partial versus complete obstruction: Secondary | ICD-10-CM

## 2023-08-28 DIAGNOSIS — Z79899 Other long term (current) drug therapy: Secondary | ICD-10-CM | POA: Insufficient documentation

## 2023-08-28 DIAGNOSIS — K469 Unspecified abdominal hernia without obstruction or gangrene: Secondary | ICD-10-CM | POA: Insufficient documentation

## 2023-08-28 DIAGNOSIS — I10 Essential (primary) hypertension: Secondary | ICD-10-CM | POA: Insufficient documentation

## 2023-08-28 DIAGNOSIS — R112 Nausea with vomiting, unspecified: Secondary | ICD-10-CM | POA: Insufficient documentation

## 2023-08-28 LAB — URINALYSIS, ROUTINE W REFLEX MICROSCOPIC
Bilirubin Urine: NEGATIVE
Glucose, UA: NEGATIVE mg/dL
Hgb urine dipstick: NEGATIVE
Ketones, ur: NEGATIVE mg/dL
Leukocytes,Ua: NEGATIVE
Nitrite: NEGATIVE
Protein, ur: >=300 mg/dL — AB
Specific Gravity, Urine: 1.025 (ref 1.005–1.030)
pH: 5 (ref 5.0–8.0)

## 2023-08-28 LAB — COMPREHENSIVE METABOLIC PANEL
ALT: 17 U/L (ref 0–44)
AST: 19 U/L (ref 15–41)
Albumin: 3.9 g/dL (ref 3.5–5.0)
Alkaline Phosphatase: 71 U/L (ref 38–126)
Anion gap: 14 (ref 5–15)
BUN: 12 mg/dL (ref 6–20)
CO2: 23 mmol/L (ref 22–32)
Calcium: 9.1 mg/dL (ref 8.9–10.3)
Chloride: 104 mmol/L (ref 98–111)
Creatinine, Ser: 1.03 mg/dL (ref 0.61–1.24)
GFR, Estimated: >60 mL/min (ref >60)
Glucose, Bld: 137 mg/dL — ABNORMAL HIGH (ref 70–99)
Potassium: 3.7 mmol/L (ref 3.5–5.1)
Sodium: 141 mmol/L (ref 135–145)
Total Bilirubin: 1 mg/dL (ref 0.3–1.2)
Total Protein: 7.8 g/dL (ref 6.5–8.1)

## 2023-08-28 LAB — CBC
HCT: 51 % (ref 39.0–52.0)
Hemoglobin: 16.2 g/dL (ref 13.0–17.0)
MCH: 25 pg — ABNORMAL LOW (ref 26.0–34.0)
MCHC: 31.8 g/dL (ref 30.0–36.0)
MCV: 78.7 fL — ABNORMAL LOW (ref 80.0–100.0)
Platelets: 386 10*3/uL (ref 150–400)
RBC: 6.48 MIL/uL — ABNORMAL HIGH (ref 4.22–5.81)
RDW: 14.1 % (ref 11.5–15.5)
WBC: 11.7 10*3/uL — ABNORMAL HIGH (ref 4.0–10.5)
nRBC: 0 % (ref 0.0–0.2)

## 2023-08-28 LAB — LIPASE, BLOOD: Lipase: 23 U/L (ref 11–51)

## 2023-08-28 MED ORDER — IOHEXOL 350 MG/ML SOLN
100.0000 mL | Freq: Once | INTRAVENOUS | Status: AC | PRN
  Administered 2023-08-28: 100 mL via INTRAVENOUS

## 2023-08-28 MED ORDER — SODIUM CHLORIDE 0.9 % IV BOLUS
1000.0000 mL | Freq: Once | INTRAVENOUS | Status: AC
  Administered 2023-08-28: 1000 mL via INTRAVENOUS

## 2023-08-28 MED ORDER — METOCLOPRAMIDE HCL 5 MG/ML IJ SOLN
10.0000 mg | Freq: Once | INTRAMUSCULAR | Status: AC
  Administered 2023-08-28: 10 mg via INTRAVENOUS
  Filled 2023-08-28: qty 2

## 2023-08-28 MED ORDER — MORPHINE SULFATE (PF) 4 MG/ML IV SOLN
4.0000 mg | Freq: Once | INTRAVENOUS | Status: AC
  Administered 2023-08-28: 4 mg via INTRAVENOUS
  Filled 2023-08-28: qty 1

## 2023-08-28 NOTE — ED Provider Notes (Signed)
[place] EMERGENCY DEPARTMENT AT [facility] Provider Note   CSN: [id number] Arrival date & time: 08/28/23  4098     History  Chief Complaint  Patient presents with   Abdominal Pain   Emesis   Nausea    Michael Rich is a 42 y.o. male past medical history of hypertension presenting to the emergency room with nausea vomiting and periumbilical abdominal pain that started around 3 AM today.  Patient reports he has had pain like this in the past however he has typically not had significant nausea and vomiting associated with it.  Patient reports last bowel movement was last night.  Patient reports he has not been able to pass gas since the pain started.  Not been able to tolerate oral intake since the pain started.  No history of abdominal surgery in the past.  Has seen surgery for periumbilical hernia however has not scheduled surgery for it.  Denies any recent diarrhea, chest pain, shortness of breath.   Abdominal Pain Associated symptoms: vomiting   Emesis Associated symptoms: abdominal pain        Home Medications Prior to Admission medications   Medication Sig Start Date End Date Taking? Authorizing Provider  amLODipine (NORVASC) 5 MG tablet Take 2 tablets (10 mg total) by mouth daily. Take 0.5 tablet per day for the first week. 10/03/15   Melancon, Hillery Hunter, MD  lisinopril (ZESTRIL) 40 MG tablet Take 40 mg by mouth daily. 01/21/21   [provider]  metoprolol succinate (TOPROL-XL) 50 MG 24 hr tablet Take 50 mg by mouth daily. 01/21/21   [provider]      Allergies    Patient has no known allergies.    Review of Systems   Review of Systems  Gastrointestinal:  Positive for abdominal pain and vomiting.    Physical Exam Updated Vital Signs BP (!) 176/113 (BP Location: Right Arm)   Pulse (!) 115   Temp 98.4 F (36.9 C) (Oral)   Resp 16   Ht 5\' 10"  (1.778 m)   Wt 124.3 kg   SpO2 93%   BMI 39.31 kg/m  Physical Exam Vitals and nursing  note reviewed.  Constitutional:      General: He is not in acute distress.    Appearance: He is not toxic-appearing.  HENT:     Head: Normocephalic and atraumatic.  Eyes:     General: No scleral icterus.    Conjunctiva/sclera: Conjunctivae normal.  Cardiovascular:     Rate and Rhythm: Normal rate and regular rhythm.     Pulses: Normal pulses.     Heart sounds: Normal heart sounds.  Pulmonary:     Effort: Pulmonary effort is normal. No respiratory distress.     Breath sounds: Normal breath sounds. No wheezing.  Abdominal:     General: Abdomen is flat. Bowel sounds are normal. There is no distension.     Palpations: Abdomen is soft. There is no mass.     Tenderness: There is no abdominal tenderness.     Hernia: A hernia is present.     Comments: Periumbilical hernia  Musculoskeletal:     Right lower leg: No edema.     Left lower leg: No edema.  Skin:    General: Skin is warm and dry.     Capillary Refill: Capillary refill takes less than 2 seconds.     Findings: No lesion.  Neurological:     General: No focal deficit present.  Mental Status: He is alert and oriented to person, place, and time. Mental status is at baseline.     Cranial Nerves: No cranial nerve deficit.     Sensory: No sensory deficit.     Motor: No weakness.     ED Results / Procedures / Treatments   Labs (all labs ordered are listed, but only abnormal results are displayed) Labs Reviewed  COMPREHENSIVE METABOLIC PANEL - Abnormal; Notable for the following components:      Result Value   Glucose, Bld 137 (*)    All other components within normal limits  CBC - Abnormal; Notable for the following components:   WBC 11.7 (*)    RBC 6.48 (*)    MCV 78.7 (*)    MCH 25.0 (*)    All other components within normal limits  URINALYSIS, ROUTINE W REFLEX MICROSCOPIC - Abnormal; Notable for the following components:   Color, Urine AMBER (*)    APPearance HAZY (*)    Protein, ur >=300 (*)    Bacteria, UA RARE  (*)    All other components within normal limits  LIPASE, BLOOD    EKG None  Radiology No results found.  Procedures Procedures    Medications Ordered in ED Medications - No data to display  ED Course/ Medical Decision Making/ A&P                                 Medical Decision Making Amount and/or Complexity of Data Reviewed Labs: ordered. Radiology: ordered.  Risk Prescription drug management.   Michael Rich 42 y.o. presented today for abd pain. Working DDx includes, but not limited to, gastroenteritis, colitis, SBO, appendicitis, cholecystitis, hepatobiliary pathology, gastritis, PUD, ACS, dissection, pancreatitis, nephrolithiasis, AAA, UTI, pyelonephritis  R/o DDx: These are considered less likely than current impression due to history of present illness, physical exam, labs/imaging findings.  Review of prior external notes: Visit to the emergency room 08/05/23  Pmhx: Umbilical hernia  Unique Tests and My Interpretation:  CBC with differential: White blood cell count 11.7, no anemia CMP: Remarkable Lipase: Unremarkable UA: Unremarkable    Imaging:  CT Abd/Pelvis with contrast: evaluate for structural/surgical etiology of patients' severe abdominal pain.  Showing mild bowel obstruction and possible incarcerated periumbilical hernia.  To this finding I consulted general surgery   Problem List / ED Course / Critical interventions / Medication management   Patient tolerating p.o. intake well, did not increase nausea or vomiting.  General surgery okay with patient going home if tolerating oral intake.  Will send patient home with return precautions as well as follow-up with the general surgeon he has been seeing for his ventral hernia. I ordered medication including morphine, Reglan for pain, nausea Reevaluation of the patient after these medicines showed that the patient improved Patients vitals assessed. Upon arrival patient is hemodynamically stable.  I  have reviewed the patients home medicines and have made adjustments as needed   Consult: General surgery for SBO and umbilical hernia.  General surgery came and consulted on the patient.  They recommend if patient is tolerating p.o. intake they are okay for outpatient management of SBO and umbilical hernia.  If patient is unable to tolerate oral intake, they recommend admitting to medicine with GEN surge consulting on the patient.  Plan: Follow-up with general surgery. Patient stable for discharge.  Tolerating p.o. intake, general surgery was okay with him following up in outpatient setting.  Understands diagnosis and explained return precautions. Return to the emergency room any with any new or worsening symptoms.        Final Clinical Impression(s) / ED Diagnoses Final diagnoses:  None    Rx / DC Orders ED Discharge Orders     None         Smitty Knudsen, PA-C 08/28/23 2210    Derwood Kaplan, MD 08/29/23 1147

## 2023-08-28 NOTE — ED Triage Notes (Signed)
Pt BIB [facility] from work d/t n/v & abd pain that began this morning. Hx of hernias & HTN, has not had any meds for BP. EMS reports BP was 180/110, 100 bpm, 95% RA, CBG 178, 18g Lt AC PIV & received zofran while en route to ED.

## 2023-08-28 NOTE — Discharge Instructions (Addendum)
You were seen in the emergency room today for abdominal pain.  Your imaging suggests small bowel obstruction, since you are tolerating oral intake general surgery is okay with you returning home.  I would recommend clear liquid diet for 2 days and slowly increasing intake with foods such as toast, crackers, rice.  If at any point your symptoms return or worsen I would recommend returning to the emergency room.  If you are no longer tolerating oral intake I recommend returning as well.  Please call the general surgeon, Dr. Luisa Hart to follow-up for today's visit.  If they cannot get you in within the next week, I would recommend calling your primary care provider for sooner follow-up.

## 2023-12-02 ENCOUNTER — Ambulatory Visit: Payer: Self-pay | Admitting: Surgery

## 2023-12-02 DIAGNOSIS — K439 Ventral hernia without obstruction or gangrene: Secondary | ICD-10-CM | POA: Diagnosis not present

## 2023-12-21 DIAGNOSIS — I10 Essential (primary) hypertension: Secondary | ICD-10-CM | POA: Diagnosis not present

## 2024-02-04 NOTE — Patient Instructions (Signed)
 SURGICAL WAITING ROOM VISITATION  Patients having surgery or a procedure may have no more than 2 support people in the waiting area - these visitors may rotate.    Children under the age of 21 must have an adult with them who is not the patient.  Due to an increase in RSV and influenza rates and associated hospitalizations, children ages 48 and under may not visit patients in [facility] hospitals.  Visitors with respiratory illnesses are discouraged from visiting and should remain at home.  If the patient needs to stay at the hospital during part of their recovery, the visitor guidelines for inpatient rooms apply. Pre-op nurse will coordinate an appropriate time for 1 support person to accompany patient in pre-op.  This support person may not rotate.    Please refer to the [facility] website for the visitor guidelines for Inpatients (after your surgery is over and you are in a regular room).       Your procedure is scheduled on:  02/17/2024    Report to [facility] Main Entrance    Report to admitting at  0515 AM   Call this number if you have problems the morning of surgery [phone]   Do not eat food :After Midnight.   After Midnight you may have the following liquids until _ 0430_____ AM  DAY OF SURGERY  Water Non-Citrus Juices (without pulp, NO RED-Apple, White grape, White cranberry) Black Coffee (NO MILK/CREAM OR CREAMERS, sugar ok)  Clear Tea (NO MILK/CREAM OR CREAMERS, sugar ok) regular and decaf                             Plain Jell-O (NO RED)                                           Fruit ices (not with fruit pulp, NO RED)                                     Popsicles (NO RED)                                                               Sports drinks like Gatorade (NO RED)                    The day of surgery:  Drink ONE (1) Pre-Surgery Clear Ensure or G2 at  0430AM  ( have completed by ) the morning of surgery. Drink in one sitting. Do not sip.   This drink was given to you during your hospital  pre-op appointment visit. Nothing else to drink after completing the  Pre-Surgery Clear Ensure or G2.          If you have questions, please contact your surgeon's office.     Oral Hygiene is also important to reduce your risk of infection.  Remember - BRUSH YOUR TEETH THE MORNING OF SURGERY WITH YOUR REGULAR TOOTHPASTE  DENTURES WILL BE REMOVED PRIOR TO SURGERY PLEASE DO NOT APPLY "Poly grip" OR ADHESIVES!!!   Do NOT smoke after Midnight   Stop all vitamins and herbal supplements 7 days before surgery.   Take these medicines the morning of surgery with A SIP OF WATER: amlodipine, toprol   DO NOT TAKE ANY ORAL DIABETIC MEDICATIONS DAY OF YOUR SURGERY  Bring CPAP mask and tubing day of surgery.                              You may not have any metal on your body including hair pins, jewelry, and body piercing             Do not wear make-up, lotions, powders, perfumes/cologne, or deodorant  Do not wear nail polish including gel and S&S, artificial/acrylic nails, or any other type of covering on natural nails including finger and toenails. If you have artificial nails, gel coating, etc. that needs to be removed by a nail salon please have this removed prior to surgery or surgery may need to be canceled/ delayed if the surgeon/ anesthesia feels like they are unable to be safely monitored.   Do not shave  48 hours prior to surgery.               Men may shave face and neck.   Do not bring valuables to the hospital. [place] IS NOT             RESPONSIBLE   FOR VALUABLES.   Contacts, glasses, dentures or bridgework may not be worn into surgery.   Bring small overnight bag day of surgery.   DO NOT BRING YOUR HOME MEDICATIONS TO THE HOSPITAL. PHARMACY WILL DISPENSE MEDICATIONS LISTED ON YOUR MEDICATION LIST TO YOU DURING YOUR ADMISSION IN THE HOSPITAL!    Patients discharged on the day of  surgery will not be allowed to drive home.  Someone NEEDS to stay with you for the first 24 hours after anesthesia.   Special Instructions: Bring a copy of your healthcare power of attorney and living will documents the day of surgery if you haven't scanned them before.              Please read over the following fact sheets you were given: IF YOU HAVE QUESTIONS ABOUT YOUR PRE-OP INSTRUCTIONS PLEASE CALL [phone]   If you received a COVID test during your pre-op visit  it is requested that you wear a mask when out in public, stay away from anyone that may not be feeling well and notify your surgeon if you develop symptoms. If you test positive for Covid or have been in contact with anyone that has tested positive in the last 10 days please notify you surgeon.    [place] - Preparing for Surgery Before surgery, you can play an important role.  Because skin is not sterile, your skin needs to be as free of germs as possible.  You can reduce the number of germs on your skin by washing with CHG (chlorahexidine gluconate) soap before surgery.  CHG is an antiseptic cleaner which kills germs and bonds with the skin to continue killing germs even after washing. Please DO NOT use if you have an allergy to CHG or antibacterial soaps.  If your skin becomes reddened/irritated stop using the CHG and inform your nurse when you  arrive at Short Stay. Do not shave (including legs and underarms) for at least 48 hours prior to the first CHG shower.  You may shave your face/neck. Please follow these instructions carefully:  1.  Shower with CHG Soap the night before surgery and the  morning of Surgery.  2.  If you choose to wash your hair, wash your hair first as usual with your  normal  shampoo.  3.  After you shampoo, rinse your hair and body thoroughly to remove the  shampoo.                           4.  Use CHG as you would any other liquid soap.  You can apply chg directly  to the skin and wash                        Gently with a scrungie or clean washcloth.  5.  Apply the CHG Soap to your body ONLY FROM THE NECK DOWN.   Do not use on face/ open                           Wound or open sores. Avoid contact with eyes, ears mouth and genitals (private parts).                       Wash face,  Genitals (private parts) with your normal soap.             6.  Wash thoroughly, paying special attention to the area where your surgery  will be performed.  7.  Thoroughly rinse your body with warm water from the neck down.  8.  DO NOT shower/wash with your normal soap after using and rinsing off  the CHG Soap.                9.  Pat yourself dry with a clean towel.            10.  Wear clean pajamas.            11.  Place clean sheets on your bed the night of your first shower and do not  sleep with pets. Day of Surgery : Do not apply any lotions/deodorants the morning of surgery.  Please wear clean clothes to the hospital/surgery center.  FAILURE TO FOLLOW THESE INSTRUCTIONS MAY RESULT IN THE CANCELLATION OF YOUR SURGERY PATIENT SIGNATURE_________________________________  NURSE SIGNATURE__________________________________  ________________________________________________________________________

## 2024-02-04 NOTE — Progress Notes (Addendum)
 Anesthesia Review:  PCP: Michael Rich  Cardiologist : none   PPM/ ICD: Device Orders: Rep Notified:  Chest x-ray : EKG : 08/06/23  and 02/08/24  Echo : Stress test: Cardiac Cath :   Activity level: can do a flight of stairs without difficutly  Sleep Study/ CPAP : none  Fasting Blood Sugar :      / Checks Blood Sugar -- times a day:    Blood Thinner/ Instructions /Last Dose: ASA / Instructions/ Last Dose :    Blood pressure was 161/100 at preop on 02/08/24.  PT denies any chest pain, shortness of breath, dizziness, headache or blurred vision.  Pt states ' I did not take my meds this am.  ".  Pt to take meds once he leaves preop.  EKG done.

## 2024-02-08 ENCOUNTER — Other Ambulatory Visit: Payer: Self-pay

## 2024-02-08 ENCOUNTER — Encounter (HOSPITAL_COMMUNITY): Payer: Self-pay

## 2024-02-08 ENCOUNTER — Encounter (HOSPITAL_COMMUNITY)
Admission: RE | Admit: 2024-02-08 | Discharge: 2024-02-08 | Disposition: A | Discharge status: Home / Self Care | Payer: 59 | Source: Ambulatory Visit | Attending: Surgery | Admitting: Surgery

## 2024-02-08 VITALS — BP 161/100 | HR 71 | Temp 98.3°F | Resp 16 | Ht 70.0 in | Wt 269.0 lb

## 2024-02-08 DIAGNOSIS — Z01818 Encounter for other preprocedural examination: Secondary | ICD-10-CM | POA: Diagnosis not present

## 2024-02-08 DIAGNOSIS — K429 Umbilical hernia without obstruction or gangrene: Secondary | ICD-10-CM | POA: Diagnosis not present

## 2024-02-08 DIAGNOSIS — I10 Essential (primary) hypertension: Secondary | ICD-10-CM | POA: Diagnosis not present

## 2024-02-08 HISTORY — DX: Personal history of urinary calculi: Z87.442

## 2024-02-08 HISTORY — DX: Anxiety disorder, unspecified: F41.9

## 2024-02-08 HISTORY — DX: Gastro-esophageal reflux disease without esophagitis: K21.9

## 2024-02-08 LAB — CBC
HCT: 47.3 % (ref 39.0–52.0)
Hemoglobin: 14.7 g/dL (ref 13.0–17.0)
MCH: 25.5 pg — ABNORMAL LOW (ref 26.0–34.0)
MCHC: 31.1 g/dL (ref 30.0–36.0)
MCV: 82 fL (ref 80.0–100.0)
Platelets: 415 10*3/uL — ABNORMAL HIGH (ref 150–400)
RBC: 5.77 MIL/uL (ref 4.22–5.81)
RDW: 13.7 % (ref 11.5–15.5)
WBC: 6.6 10*3/uL (ref 4.0–10.5)
nRBC: 0 % (ref 0.0–0.2)

## 2024-02-08 LAB — BASIC METABOLIC PANEL
Anion gap: 6 (ref 5–15)
BUN: 11 mg/dL (ref 6–20)
CO2: 23 mmol/L (ref 22–32)
Calcium: 8.6 mg/dL — ABNORMAL LOW (ref 8.9–10.3)
Chloride: 107 mmol/L (ref 98–111)
Creatinine, Ser: 0.88 mg/dL (ref 0.61–1.24)
GFR, Estimated: >60 mL/min (ref >60)
Glucose, Bld: 113 mg/dL — ABNORMAL HIGH (ref 70–99)
Potassium: 3.5 mmol/L (ref 3.5–5.1)
Sodium: 136 mmol/L (ref 135–145)

## 2024-02-09 NOTE — Progress Notes (Signed)
 Anesthesia Chart Review   Case: [id number] Date/Time: 02/17/24 0715   Procedure: REPAIR UMBILICAL HERNIA WITH MESH   Anesthesia type: General   Pre-op diagnosis: Umbilical Hernia   Location: [facility] ROOM 01 / [facility]   Surgeons: Harriette Bouillon, MD       DISCUSSION:42 y.o. never smoker with h/o HTN, GERD, umbilical hernia scheduled for above procedure 02/17/2024 Dr. Harriette Bouillon.   Elevated BP at PAT visit. Pt reports he had not taken his medications. Evaluate DOS.   BP Readings from Last 3 Encounters:  02/08/24 (!) 161/100  08/28/23 (!) 150/75  08/06/23 (!) 158/96    VS: BP (!) 161/100   Pulse 71   Temp 36.8 C (Oral)   Resp 16   Ht 5\' 10"  (1.778 m)   Wt 122 kg   SpO2 97%   BMI 38.60 kg/m   PROVIDERS: Deatra James, MD is PCP    LABS: Labs reviewed: Acceptable for surgery. (all labs ordered are listed, but only abnormal results are displayed)  Labs Reviewed  BASIC METABOLIC PANEL - Abnormal; Notable for the following components:      Result Value   Glucose, Bld 113 (*)    Calcium 8.6 (*)    All other components within normal limits  CBC - Abnormal; Notable for the following components:   MCH 25.5 (*)    Platelets 415 (*)    All other components within normal limits     IMAGES:   EKG:   CV:  Past Medical History:  Diagnosis Date   Anxiety    Essential hypertension    GERD (gastroesophageal reflux disease)    History of kidney stones     History reviewed. No pertinent surgical history.  MEDICATIONS:  amLODipine (NORVASC) 10 MG tablet   amLODipine (NORVASC) 5 MG tablet   lisinopril (ZESTRIL) 40 MG tablet   metoprolol succinate (TOPROL-XL) 100 MG 24 hr tablet   No current facility-administered medications for this encounter.      Jodell Cipro Ward, PA-C [facility] Pre-Surgical Testing [phone]

## 2024-02-17 ENCOUNTER — Ambulatory Visit (HOSPITAL_BASED_OUTPATIENT_CLINIC_OR_DEPARTMENT_OTHER): Admitting: Certified Registered Nurse Anesthetist

## 2024-02-17 ENCOUNTER — Encounter (HOSPITAL_COMMUNITY)
Admission: RE | Disposition: A | Discharge status: Home / Self Care | Payer: Self-pay | Source: Home / Self Care | Attending: Surgery

## 2024-02-17 ENCOUNTER — Other Ambulatory Visit: Payer: Self-pay

## 2024-02-17 ENCOUNTER — Encounter (HOSPITAL_COMMUNITY): Payer: Self-pay | Admitting: Surgery

## 2024-02-17 ENCOUNTER — Ambulatory Visit (HOSPITAL_COMMUNITY)
Admission: RE | Admit: 2024-02-17 | Discharge: 2024-02-17 | Disposition: A | Discharge status: Home / Self Care | Payer: Self-pay | Attending: Surgery | Admitting: Surgery

## 2024-02-17 ENCOUNTER — Ambulatory Visit (HOSPITAL_COMMUNITY): Payer: Self-pay | Admitting: Physician Assistant

## 2024-02-17 DIAGNOSIS — K429 Umbilical hernia without obstruction or gangrene: Secondary | ICD-10-CM | POA: Insufficient documentation

## 2024-02-17 DIAGNOSIS — K219 Gastro-esophageal reflux disease without esophagitis: Secondary | ICD-10-CM | POA: Diagnosis not present

## 2024-02-17 DIAGNOSIS — Z01818 Encounter for other preprocedural examination: Secondary | ICD-10-CM

## 2024-02-17 DIAGNOSIS — I10 Essential (primary) hypertension: Secondary | ICD-10-CM | POA: Diagnosis not present

## 2024-02-17 HISTORY — PX: UMBILICAL HERNIA REPAIR: SHX196

## 2024-02-17 SURGERY — REPAIR, HERNIA, UMBILICAL, ADULT
Anesthesia: General

## 2024-02-17 MED ORDER — SUGAMMADEX SODIUM 200 MG/2ML IV SOLN
INTRAVENOUS | Status: DC | PRN
  Administered 2024-02-17: 200 mg via INTRAVENOUS

## 2024-02-17 MED ORDER — ACETAMINOPHEN 500 MG PO TABS
1000.0000 mg | ORAL_TABLET | ORAL | Status: AC
  Administered 2024-02-17: 1000 mg via ORAL
  Filled 2024-02-17: qty 2

## 2024-02-17 MED ORDER — CHLORHEXIDINE GLUCONATE CLOTH 2 % EX PADS
6.0000 | MEDICATED_PAD | Freq: Once | CUTANEOUS | Status: DC
Start: 2024-02-17 — End: 2024-02-17

## 2024-02-17 MED ORDER — ROCURONIUM BROMIDE 10 MG/ML (PF) SYRINGE
PREFILLED_SYRINGE | INTRAVENOUS | Status: AC
  Filled 2024-02-17: qty 10

## 2024-02-17 MED ORDER — OXYCODONE HCL 5 MG PO TABS: 5.0000 mg | ORAL_TABLET | Freq: Four times a day (QID) | ORAL | 0 refills | Status: AC | PRN

## 2024-02-17 MED ORDER — LIDOCAINE 2% (20 MG/ML) 5 ML SYRINGE
INTRAMUSCULAR | Status: DC | PRN
  Administered 2024-02-17: 60 mg via INTRAVENOUS

## 2024-02-17 MED ORDER — DEXAMETHASONE SODIUM PHOSPHATE 10 MG/ML IJ SOLN
INTRAMUSCULAR | Status: DC | PRN
  Administered 2024-02-17: 10 mg via INTRAVENOUS

## 2024-02-17 MED ORDER — CEFAZOLIN SODIUM-DEXTROSE 2-4 GM/100ML-% IV SOLN
2.0000 g | INTRAVENOUS | Status: DC
  Filled 2024-02-17: qty 100

## 2024-02-17 MED ORDER — MIDAZOLAM HCL 2 MG/2ML IJ SOLN
INTRAMUSCULAR | Status: AC
  Filled 2024-02-17: qty 2

## 2024-02-17 MED ORDER — CEFAZOLIN SODIUM-DEXTROSE 3-4 GM/150ML-% IV SOLN
INTRAVENOUS | Status: AC
  Filled 2024-02-17: qty 150

## 2024-02-17 MED ORDER — PROPOFOL 10 MG/ML IV BOLUS
INTRAVENOUS | Status: DC | PRN
  Administered 2024-02-17: 200 mg via INTRAVENOUS

## 2024-02-17 MED ORDER — DEXAMETHASONE SODIUM PHOSPHATE 10 MG/ML IJ SOLN
INTRAMUSCULAR | Status: AC
  Filled 2024-02-17: qty 3

## 2024-02-17 MED ORDER — ACETAMINOPHEN 500 MG PO TABS: 1000.0000 mg | ORAL_TABLET | Freq: Once | ORAL | Status: DC | PRN

## 2024-02-17 MED ORDER — ACETAMINOPHEN 160 MG/5ML PO SOLN: 1000.0000 mg | Freq: Once | ORAL | Status: DC | PRN

## 2024-02-17 MED ORDER — LIDOCAINE HCL (PF) 2 % IJ SOLN
INTRAMUSCULAR | Status: AC
  Filled 2024-02-17: qty 15

## 2024-02-17 MED ORDER — EPHEDRINE 5 MG/ML INJ
INTRAVENOUS | Status: AC
  Filled 2024-02-17: qty 5

## 2024-02-17 MED ORDER — ONDANSETRON HCL 4 MG/2ML IJ SOLN
INTRAMUSCULAR | Status: DC | PRN
  Administered 2024-02-17: 4 mg via INTRAVENOUS

## 2024-02-17 MED ORDER — PHENYLEPHRINE 80 MCG/ML (10ML) SYRINGE FOR IV PUSH (FOR BLOOD PRESSURE SUPPORT)
PREFILLED_SYRINGE | INTRAVENOUS | Status: AC
  Filled 2024-02-17: qty 10

## 2024-02-17 MED ORDER — 0.9 % SODIUM CHLORIDE (POUR BTL) OPTIME
TOPICAL | Status: DC | PRN
  Administered 2024-02-17: 1000 mL

## 2024-02-17 MED ORDER — CHLORHEXIDINE GLUCONATE 0.12 % MT SOLN
15.0000 mL | Freq: Once | OROMUCOSAL | Status: AC
  Administered 2024-02-17: 15 mL via OROMUCOSAL

## 2024-02-17 MED ORDER — OXYCODONE HCL 5 MG/5ML PO SOLN: 5.0000 mg | Freq: Once | ORAL | Status: DC | PRN

## 2024-02-17 MED ORDER — PROPOFOL 10 MG/ML IV BOLUS
INTRAVENOUS | Status: AC
  Filled 2024-02-17: qty 20

## 2024-02-17 MED ORDER — DEXTROSE 5 % IV SOLN
INTRAVENOUS | Status: DC | PRN
  Administered 2024-02-17: 3 g via INTRAVENOUS

## 2024-02-17 MED ORDER — GABAPENTIN 300 MG PO CAPS
300.0000 mg | ORAL_CAPSULE | ORAL | Status: AC
  Administered 2024-02-17: 300 mg via ORAL
  Filled 2024-02-17: qty 1

## 2024-02-17 MED ORDER — ACETAMINOPHEN 10 MG/ML IV SOLN: 1000.0000 mg | Freq: Once | INTRAVENOUS | Status: DC | PRN

## 2024-02-17 MED ORDER — SUGAMMADEX SODIUM 200 MG/2ML IV SOLN
INTRAVENOUS | Status: AC
  Filled 2024-02-17: qty 2

## 2024-02-17 MED ORDER — FENTANYL CITRATE (PF) 100 MCG/2ML IJ SOLN
INTRAMUSCULAR | Status: AC
  Filled 2024-02-17: qty 2

## 2024-02-17 MED ORDER — ONDANSETRON HCL 4 MG/2ML IJ SOLN
INTRAMUSCULAR | Status: AC
  Filled 2024-02-17: qty 2

## 2024-02-17 MED ORDER — OXYCODONE HCL 5 MG PO TABS: 5.0000 mg | ORAL_TABLET | Freq: Once | ORAL | Status: DC | PRN

## 2024-02-17 MED ORDER — IBUPROFEN 800 MG PO TABS
800.0000 mg | ORAL_TABLET | Freq: Three times a day (TID) | ORAL | 0 refills | Status: AC | PRN
Start: 2024-02-17 — End: ?

## 2024-02-17 MED ORDER — ORAL CARE MOUTH RINSE: 15.0000 mL | Freq: Once | OROMUCOSAL | Status: AC

## 2024-02-17 MED ORDER — METHOCARBAMOL 750 MG PO TABS: 750.0000 mg | ORAL_TABLET | Freq: Three times a day (TID) | ORAL | 0 refills | Status: AC | PRN

## 2024-02-17 MED ORDER — FENTANYL CITRATE PF 50 MCG/ML IJ SOSY: 25.0000 ug | PREFILLED_SYRINGE | INTRAMUSCULAR | Status: DC | PRN

## 2024-02-17 MED ORDER — FENTANYL CITRATE (PF) 100 MCG/2ML IJ SOLN
INTRAMUSCULAR | Status: DC | PRN
  Administered 2024-02-17: 100 ug via INTRAVENOUS

## 2024-02-17 MED ORDER — ONDANSETRON HCL 4 MG/2ML IJ SOLN
INTRAMUSCULAR | Status: AC
  Filled 2024-02-17: qty 6

## 2024-02-17 MED ORDER — MIDAZOLAM HCL 5 MG/5ML IJ SOLN
INTRAMUSCULAR | Status: DC | PRN
  Administered 2024-02-17: 2 mg via INTRAVENOUS

## 2024-02-17 MED ORDER — BUPIVACAINE-EPINEPHRINE (PF) 0.25% -1:200000 IJ SOLN
INTRAMUSCULAR | Status: AC
  Filled 2024-02-17: qty 30

## 2024-02-17 MED ORDER — DEXMEDETOMIDINE HCL IN NACL 80 MCG/20ML IV SOLN
INTRAVENOUS | Status: AC
  Filled 2024-02-17: qty 20

## 2024-02-17 MED ORDER — BUPIVACAINE-EPINEPHRINE 0.25% -1:200000 IJ SOLN
INTRAMUSCULAR | Status: DC | PRN
  Administered 2024-02-17: 30 mL

## 2024-02-17 MED ORDER — LACTATED RINGERS IV SOLN
INTRAVENOUS | Status: DC
Start: 2024-02-17 — End: 2024-02-17

## 2024-02-17 MED ORDER — ROCURONIUM BROMIDE 10 MG/ML (PF) SYRINGE
PREFILLED_SYRINGE | INTRAVENOUS | Status: DC | PRN
  Administered 2024-02-17: 80 mg via INTRAVENOUS

## 2024-02-17 MED ORDER — DEXAMETHASONE SODIUM PHOSPHATE 10 MG/ML IJ SOLN
INTRAMUSCULAR | Status: AC
  Filled 2024-02-17: qty 1

## 2024-02-17 SURGICAL SUPPLY — 35 items
BAG COUNTER SPONGE SURGICOUNT (BAG) IMPLANT
BENZOIN TINCTURE PRP APPL 2/3 (GAUZE/BANDAGES/DRESSINGS) ×2 IMPLANT
BINDER ABDOMINAL 12 ML 46-62 (SOFTGOODS) IMPLANT
BLADE HEX COATED 2.75 (ELECTRODE) ×2 IMPLANT
DERMABOND ADVANCED .7 DNX12 (GAUZE/BANDAGES/DRESSINGS) IMPLANT
DRAIN CHANNEL 19F RND (DRAIN) IMPLANT
DRAPE LAPAROTOMY T 102X78X121 (DRAPES) ×2 IMPLANT
DRSG TEGADERM 4X4.75 (GAUZE/BANDAGES/DRESSINGS) IMPLANT
ELECT REM PT RETURN 15FT ADLT (MISCELLANEOUS) ×2 IMPLANT
EVACUATOR SILICONE 100CC (DRAIN) IMPLANT
GAUZE SPONGE 4X4 12PLY STRL (GAUZE/BANDAGES/DRESSINGS) ×2 IMPLANT
GLOVE BIOGEL PI IND STRL 7.0 (GLOVE) ×2 IMPLANT
GLOVE INDICATOR 8.0 STRL GRN (GLOVE) ×4 IMPLANT
GLOVE SS BIOGEL STRL SZ 7.5 (GLOVE) ×2 IMPLANT
GOWN STRL REUS W/ TWL XL LVL3 (GOWN DISPOSABLE) ×4 IMPLANT
KIT BASIN OR (CUSTOM PROCEDURE TRAY) ×2 IMPLANT
KIT TURNOVER KIT A (KITS) IMPLANT
MESH OVITEX 1S PERM 6X10 6L (Mesh General) IMPLANT
NDL HYPO 22X1.5 SAFETY MO (MISCELLANEOUS) ×2 IMPLANT
NEEDLE HYPO 22X1.5 SAFETY MO (MISCELLANEOUS) ×1 IMPLANT
NS IRRIG 1000ML POUR BTL (IV SOLUTION) ×2 IMPLANT
PACK GENERAL/GYN (CUSTOM PROCEDURE TRAY) ×2 IMPLANT
SPIKE FLUID TRANSFER (MISCELLANEOUS) ×2 IMPLANT
SPONGE DRAIN TRACH 4X4 STRL 2S (GAUZE/BANDAGES/DRESSINGS) IMPLANT
STRIP CLOSURE SKIN 1/2X4 (GAUZE/BANDAGES/DRESSINGS) IMPLANT
SUT ETHILON 2 0 PS N (SUTURE) IMPLANT
SUT MNCRL AB 4-0 PS2 18 (SUTURE) ×2 IMPLANT
SUT NOVA 1 T20/GS 25DT (SUTURE) IMPLANT
SUT NOVA NAB DX-16 0-1 5-0 T12 (SUTURE) IMPLANT
SUT PROLENE 0 CT 1 30 (SUTURE) IMPLANT
SUT PROLENE 0 CT 1 CR/8 (SUTURE) IMPLANT
SUT VIC AB 2-0 SH 27X BRD (SUTURE) IMPLANT
SUT VIC AB 3-0 SH 18 (SUTURE) IMPLANT
SYR CONTROL 10ML LL (SYRINGE) ×2 IMPLANT
TOWEL OR 17X26 10 PK STRL BLUE (TOWEL DISPOSABLE) ×2 IMPLANT

## 2024-02-17 NOTE — Anesthesia Procedure Notes (Signed)
 Procedure Name: Intubation Date/Time: 02/17/2024 7:34 AM  Performed by: Basilio Cairo, CRNAPre-anesthesia Checklist: Patient identified, Patient being monitored, Timeout performed, Emergency Drugs available and Suction available Patient Re-evaluated:Patient Re-evaluated prior to induction Oxygen Delivery Method: Circle system utilized Preoxygenation: Pre-oxygenation with 100% oxygen Induction Type: IV induction Ventilation: Mask ventilation without difficulty Laryngoscope Size: Mac and 3 Grade View: Grade I Tube type: Oral Tube size: 7.5 mm Number of attempts: 1 Airway Equipment and Method: Stylet Placement Confirmation: ETT inserted through vocal cords under direct vision, positive ETCO2 and breath sounds checked- equal and bilateral Secured at: 23 cm Tube secured with: Tape Dental Injury: Teeth and Oropharynx as per pre-operative assessment

## 2024-02-17 NOTE — Transfer of Care (Signed)
 Immediate Anesthesia Transfer of Care Note  Patient: Michael Rich  Procedure(s) Performed: Procedure(s): REPAIR UMBILICAL HERNIA WITH MESH (N/A)  Patient Location: PACU  Anesthesia Type:General  Level of Consciousness: Alert, Awake, Oriented  Airway & Oxygen Therapy: Patient Spontanous Breathing  Post-op Assessment: Report given to RN  Post vital signs: Reviewed and stable  Last Vitals:  Vitals:   02/17/24 0551 02/17/24 0927  BP: (!) 171/100   Pulse: 82 74  Resp: 18 (!) 24  Temp: 36.8 C   SpO2: 95% 96%    Complications: No apparent anesthesia complications

## 2024-02-17 NOTE — Anesthesia Preprocedure Evaluation (Signed)
 Anesthesia Evaluation  Patient identified by MRN, date of birth, ID band Patient awake    Reviewed: Allergy & Precautions, NPO status , Patient's Chart, lab work & pertinent test results  History of Anesthesia Complications Negative for: history of anesthetic complications  Airway Mallampati: II       Dental  (+) Teeth Intact, Dental Advisory Given, Missing,    Pulmonary neg pulmonary ROS, neg shortness of breath, neg sleep apnea, neg COPD, neg recent URI   breath sounds clear to auscultation       Cardiovascular hypertension, Pt. on medications and Pt. on home beta blockers (-) angina (-) Past MI and (-) CHF  Rhythm:Regular     Neuro/Psych negative neurological ROS     GI/Hepatic Neg liver ROS,GERD  ,,  Endo/Other  negative endocrine ROS    Renal/GU negative Renal ROS     Musculoskeletal negative musculoskeletal ROS (+)    Abdominal   Peds  Hematology negative hematology ROS (+)   Anesthesia Other Findings   Reproductive/Obstetrics                             Anesthesia Physical Anesthesia Plan  ASA: 2  Anesthesia Plan: General   Post-op Pain Management: Tylenol PO (pre-op)*, Toradol IV (intra-op)* and Gabapentin PO (pre-op)*   Induction: Intravenous  PONV Risk Score and Plan: 3 and Ondansetron and Dexamethasone  Airway Management Planned: Oral ETT  Additional Equipment: None  Intra-op Plan:   Post-operative Plan: Extubation in OR  Informed Consent: I have reviewed the patients History and Physical, chart, labs and discussed the procedure including the risks, benefits and alternatives for the proposed anesthesia with the patient or authorized representative who has indicated his/her understanding and acceptance.     Dental advisory given  Plan Discussed with: CRNA  Anesthesia Plan Comments:        Anesthesia Quick Evaluation

## 2024-02-17 NOTE — Op Note (Signed)
 Preoperative diagnosis: Periumbilical hernia measuring 3 cm reducible initial  Postoperative diagnosis: Periumbilical hernia measuring 5 cm x 4 cm with significant diastases recti  Procedure: Open repair of periumbilical hernia with overtax 6 cm x 10 cm 1S permanent mesh onlay  Surgeon: Harriette Bouillon, MD  Anesthesia: General With 0.25% Marcaine with epinephrine  Drains: 19 round  Specimen: None  EBL: 20 cc  Indications for procedure: The patient is a 43 year old male with a periumbilical hernia.  He was diagnosed last September in the emergency room when he presented with signs of incarceration.  This was reduced and he was sent home.  I saw him in January 2025 and recommended repair.  CT scanning reviewed which showed the area to be by my exam about 2 cm to 3 cm.  It was reduced at the time of evaluation.  We discussed open and laparoscopic techniques.  I felt the small enough to repair open based on his exam and CT findings.  Risks and benefits reviewed of hernia surgery as well as mesh use.The risk of hernia repair include bleeding,  Infection,   Recurrence of the hernia,  Mesh use, chronic pain,  Organ injury,  Bowel injury,  Bladder injury,   nerve injury with numbness around the incision,  Death,  and worsening of preexisting  medical problems.  The alternatives to surgery have been discussed as well..  Long term expectations of both operative and non operative treatments have been discussed.   The patient agrees to proceed.    Description of procedure: The patient was met in the holding area questions were answered.  He was then taken back to the operative room.  He is placed upon upon the OR table.  After induction of general anesthesia, his abdomen was prepped and draped in sterile fashion and timeout performed.  He received appropriate preoperative antibiotics.  Proper patient, site of procedure verified.  A periumbilical incision was made along the inferior border of the umbilicus in a  U-shaped configuration and then extended laterally about a centimeter each way.  I was able to dissect the umbilicus off the hernia sac.  This hernia sac was quite thick.  He also had significant diastases recti around this extending more superior.  Once I completely dissected the umbilicus of the abdominal wall, I placed retractors.  The defect itself end of being closer to 5 cm in maximal diameter once I was able to grasp the fascial edges.  Some of this was actual hernia with a large hernia sac.  Once I dissected out the entire sac I opened the sac.  It was extremely thickened.  There were no loops of bowel or omentum within this.  It was quite chronic appearing.  I went ahead and excised the sac circumferentially with cautery.  I did examine the small bowel below this.  There were signs of this being incarcerated at some point in time due to some inflammatory changes but there is no injury to the bowel.  This was replaced back into the abdominal cavity.  I closed the thickened sac with a running 2-0 Vicryl.  At this point the hernia itself was closed but the area around it was quite weak.  I went ahead and mobilized the fatty tissue over both the rectus muscles.  I then opted to imbricate the rectus muscles back to the midline using interrupted #1 Novafil suture.  An onlay of the Bovatec mesh was used and cut to overlap the closure by 3 cm or  so in all directions.  This was secured to the anterior fascia with #1 Novafil circumferentially.  Due to his obesity, there is a significant cavity and I placed a 19 round drain in this to help manage postoperative seroma.  Local anesthetic was infiltrated throughout.  The deep tissue planes were then approximated using 3-0 Vicryl to help close the cavity down in a subcutaneous manner.  4 Monocryl used to close the skin in a subcuticular fashion.  Dermabond applied.  Drain placed to bulb suction.  Abdominal binder placed.  All counts found to be correct.  The patient was  then awoke extubated taken to recovery in satisfactory condition.

## 2024-02-17 NOTE — Anesthesia Postprocedure Evaluation (Signed)
 Anesthesia Post Note  Patient: Michael Rich  Procedure(s) Performed: REPAIR UMBILICAL HERNIA WITH MESH     Patient location during evaluation: PACU Anesthesia Type: General Level of consciousness: awake and alert Pain management: pain level controlled Vital Signs Assessment: post-procedure vital signs reviewed and stable Respiratory status: spontaneous breathing, nonlabored ventilation and respiratory function stable Cardiovascular status: blood pressure returned to baseline and stable Postop Assessment: no apparent nausea or vomiting Anesthetic complications: no   No notable events documented.  Last Vitals:  Vitals:   02/17/24 1115 02/17/24 1129  BP:  (!) 170/100  Pulse: 73 87  Resp: 20 18  Temp:  36.7 C  SpO2: 93% 91%    Last Pain:  Vitals:   02/17/24 1129  TempSrc: (P) Oral  PainSc: 0-No pain                 Dessire Grimes

## 2024-02-17 NOTE — Discharge Instructions (Signed)

## 2024-02-17 NOTE — H&P (Signed)
 History of Present Illness: Michael Rich is a 43 y.o. male who is seen today for follow-up of umbilical/ventral hernia. This measured about 2 cm. He has had 1 episode of a trip to ED due to abdominal pain in nausea vomiting. He still having more pain at the umbilicus and desires repair. Bowel function normal. No nausea vomiting..    Review of Systems: A complete review of systems was obtained from the patient. I have reviewed this information and discussed as appropriate with the patient. See HPI as well for other ROS.    Medical History: Past Medical History:  Diagnosis Date  GERD (gastroesophageal reflux disease)  Hypertension   There is no problem list on file for this patient.  History reviewed. No pertinent surgical history.   No Known Allergies  Current Outpatient Medications on File Prior to Visit  Medication Sig Dispense Refill  amLODIPine (NORVASC) 10 MG tablet 1 tablet Orally Once a day for 90 days  linaCLOtide (LINZESS) 290 mcg capsule 1 capsule orally once a day with first meal for 90 days  lisinopriL (ZESTRIL) 40 MG tablet Take 1 tablet by mouth once daily  metoprolol succinate (TOPROL-XL) 50 MG XL tablet Take 50 mg by mouth once daily  pantoprazole (PROTONIX) 40 MG DR tablet 1 tablet Orally twice a day for 30 days   No current facility-administered medications on file prior to visit.   Family History  Problem Relation Age of Onset  High blood pressure (Hypertension) Mother  Stroke Father  High blood pressure (Hypertension) Father  High blood pressure (Hypertension) Brother    Social History   Tobacco Use  Smoking Status Never  Smokeless Tobacco Never    Social History   Socioeconomic History  Marital status: Single  Tobacco Use  Smoking status: Never  Smokeless tobacco: Never  Substance and Sexual Activity  Alcohol use: Not Currently  Drug use: Never   Social Drivers of Health   Housing Stability: Unknown (12/02/2023)  Housing Stability Vital  Sign  Homeless in the Last Year: No   Objective:   Vitals:  12/02/23 1358  PainSc: 0-No pain   There is no height or weight on file to calculate BMI.  Physical Exam Cardiovascular:  Rate and Rhythm: Normal rate.  Pulmonary:  Effort: Pulmonary effort is normal.  Abdominal:   Comments: Reducible 2 cm periumbilical hernia  Neurological:  General: No focal deficit present.  Mental Status: He is alert.     Labs, Imaging and Diagnostic Testing:  CLINICAL DATA: Periumbilical abdominal pain. Hernia suspected.  EXAM: CT ABDOMEN AND PELVIS WITH CONTRAST  TECHNIQUE: Multidetector CT imaging of the abdomen and pelvis was performed using the standard protocol following bolus administration of intravenous contrast.  RADIATION DOSE REDUCTION: This exam was performed according to the departmental dose-optimization program which includes automated exposure control, adjustment of the mA and/or kV according to patient size and/or use of iterative reconstruction technique.  CONTRAST: OMNIPAQUE IOHEXOL 350 MG/ML SOLN  COMPARISON: Abdominopelvic CT 08/06/2023 and 07/27/2023.  FINDINGS: Lower chest: Stable mild dependent atelectasis or scarring at both lung bases. No significant pleural or pericardial effusion. There is a small hiatal hernia.  Hepatobiliary: The liver is normal in density without suspicious focal abnormality. No evidence of gallstones, gallbladder wall thickening or biliary dilatation.  Pancreas: Unremarkable. No pancreatic ductal dilatation or surrounding inflammatory changes.  Spleen: Normal in size without focal abnormality.  Adrenals/Urinary Tract: Both adrenal glands appear normal. Stable nonobstructing left renal calculi, measuring up to 6 mm in  the lower pole. No evidence of ureteral calculus or hydronephrosis. No evidence of renal mass. The bladder appears unremarkable for its degree of distention.  Stomach/Bowel: No enteric contrast  administered. The stomach appears unremarkable for its degree of distention. There is no significant proximal small bowel distension. However, there is increasing fluid-filled mid small bowel distension consistent with a small bowel obstruction. The distal small bowel appears decompressed, and the probable cause of the obstruction is the previously demonstrated small umbilical hernia which has mildly enlarged since the previous CT. The appendix appears normal. The colon is decompressed without wall thickening or distention.  Vascular/Lymphatic: There are no enlarged abdominal or pelvic lymph nodes. Aortic and branch vessel atherosclerosis without evidence of aneurysm or large vessel occlusion. The superior mesenteric, portal and splenic veins are patent.  Reproductive: The prostate gland and seminal vesicles appear unremarkable.  Other: As seen on previous studies, there is a small umbilical hernia containing small bowel. This hernia has mildly enlarged from the previous studies, and as discussed above now does appear to be associated with a small bowel obstruction. No significant inflammatory changes are seen around the hernia, although this could reflect an incarcerated hernia based on provided history. No ascites, focal extraluminal fluid collection or pneumoperitoneum.  Musculoskeletal: No acute or significant osseous findings.  Unless specific follow-up recommendations are mentioned in the findings or impression sections, no imaging follow-up of any mentioned incidental findings is recommended.  IMPRESSION: 1. Mid small bowel obstruction secondary to an enlarging small umbilical hernia containing small bowel. This could reflect an incarcerated hernia based on provided history. Surgical consultation recommended. 2. No evidence of bowel perforation or abscess. 3. Nonobstructing left renal calculi. 4. Aortic Atherosclerosis (ICD10-I70.0).   Electronically Signed By:  Carey Bullocks M.D. On: 08/28/2023 14:14  Assessment and Plan:   Diagnoses and all orders for this visit:  Ventral hernia without obstruction or gangrene    Recommend repair of periumbilical/ventral hernia measuring 2 cm reducible nonobstructed  Due to symptoms and pain, he desires repair. Surgical options discussed and reviewed. Questions answered. Risk of hernia surgery include but not exclusive of bleeding, infection, recurrence, pain, use of mesh, bowel injury, bladder injury, injury to other internal viscera, open surgery, recurrence, the need for the treatments and procedures.  No follow-ups on file.  Hayden Rasmussen, MD

## 2024-02-17 NOTE — Interval H&P Note (Signed)
 History and Physical Interval Note:  02/17/2024 7:25 AM  Michael Rich  has presented today for surgery, with the diagnosis of Umbilical Hernia.  The various methods of treatment have been discussed with the patient and family. After consideration of risks, benefits and other options for treatment, the patient has consented to  Procedure(s): REPAIR UMBILICAL HERNIA WITH MESH (N/A) as a surgical intervention.  The patient's history has been reviewed, patient examined, no change in status, stable for surgery.  I have reviewed the patient's chart and labs.  Questions were answered to the patient's satisfaction.     [name] [employer]

## 2024-02-18 ENCOUNTER — Encounter (HOSPITAL_COMMUNITY): Payer: Self-pay | Admitting: Surgery

## 2024-03-10 DIAGNOSIS — K439 Ventral hernia without obstruction or gangrene: Secondary | ICD-10-CM | POA: Diagnosis not present

## 2024-06-26 DIAGNOSIS — Z0000 Encounter for general adult medical examination without abnormal findings: Secondary | ICD-10-CM | POA: Diagnosis not present

## 2024-06-26 DIAGNOSIS — Z125 Encounter for screening for malignant neoplasm of prostate: Secondary | ICD-10-CM | POA: Diagnosis not present

## 2024-06-26 DIAGNOSIS — E7800 Pure hypercholesterolemia, unspecified: Secondary | ICD-10-CM | POA: Diagnosis not present

## 2024-06-26 DIAGNOSIS — J019 Acute sinusitis, unspecified: Secondary | ICD-10-CM | POA: Diagnosis not present

## 2024-06-26 DIAGNOSIS — Z113 Encounter for screening for infections with a predominantly sexual mode of transmission: Secondary | ICD-10-CM | POA: Diagnosis not present

## 2024-06-26 DIAGNOSIS — I10 Essential (primary) hypertension: Secondary | ICD-10-CM | POA: Diagnosis not present

## 2024-06-26 DIAGNOSIS — K5909 Other constipation: Secondary | ICD-10-CM | POA: Diagnosis not present

## 2024-06-26 DIAGNOSIS — K219 Gastro-esophageal reflux disease without esophagitis: Secondary | ICD-10-CM | POA: Diagnosis not present

## 2024-06-27 DIAGNOSIS — Z113 Encounter for screening for infections with a predominantly sexual mode of transmission: Secondary | ICD-10-CM | POA: Diagnosis not present
